# Patient Record
Sex: Female | Born: 1945 | Race: White | Hispanic: No | Smoking: Never smoker
Health system: Southern US, Community
[De-identification: ages and names within clinical notes are randomized; demographics above are authoritative.]

## PROBLEM LIST (undated history)

## (undated) ENCOUNTER — Emergency Department (HOSPITAL_COMMUNITY): Admission: EM | Discharge status: Against Medical Advice | Payer: Medicare Other

## (undated) DIAGNOSIS — I38 Endocarditis, valve unspecified: Secondary | ICD-10-CM

## (undated) DIAGNOSIS — K5790 Diverticulosis of intestine, part unspecified, without perforation or abscess without bleeding: Secondary | ICD-10-CM

## (undated) DIAGNOSIS — E119 Type 2 diabetes mellitus without complications: Secondary | ICD-10-CM

## (undated) DIAGNOSIS — K219 Gastro-esophageal reflux disease without esophagitis: Secondary | ICD-10-CM

## (undated) DIAGNOSIS — K222 Esophageal obstruction: Secondary | ICD-10-CM

## (undated) DIAGNOSIS — E7800 Pure hypercholesterolemia, unspecified: Secondary | ICD-10-CM

## (undated) DIAGNOSIS — G4733 Obstructive sleep apnea (adult) (pediatric): Secondary | ICD-10-CM

## (undated) DIAGNOSIS — Z9989 Dependence on other enabling machines and devices: Secondary | ICD-10-CM

## (undated) DIAGNOSIS — K2970 Gastritis, unspecified, without bleeding: Secondary | ICD-10-CM

## (undated) DIAGNOSIS — E039 Hypothyroidism, unspecified: Secondary | ICD-10-CM

## (undated) HISTORY — DX: Gastro-esophageal reflux disease without esophagitis: K21.9

## (undated) HISTORY — PX: TOTAL ABDOMINAL HYSTERECTOMY: SHX209

## (undated) HISTORY — PX: TUBAL LIGATION: SHX77

## (undated) HISTORY — DX: Gastritis, unspecified, without bleeding: K29.70

## (undated) HISTORY — PX: APPENDECTOMY: SHX54

## (undated) HISTORY — DX: Esophageal obstruction: K22.2

## (undated) HISTORY — DX: Hypothyroidism, unspecified: E03.9

## (undated) HISTORY — PX: CHOLECYSTECTOMY: SHX55

## (undated) HISTORY — DX: Pure hypercholesterolemia, unspecified: E78.00

## (undated) HISTORY — DX: Type 2 diabetes mellitus without complications: E11.9

## (undated) HISTORY — DX: Diverticulosis of intestine, part unspecified, without perforation or abscess without bleeding: K57.90

---

## 1988-12-26 HISTORY — PX: BREAST BIOPSY: SHX20

## 1999-01-28 ENCOUNTER — Ambulatory Visit (HOSPITAL_COMMUNITY): Admission: RE | Admit: 1999-01-28 | Discharge: 1999-01-28 | Payer: Self-pay | Admitting: Family Medicine

## 1999-01-28 ENCOUNTER — Encounter: Payer: Self-pay | Admitting: Family Medicine

## 1999-12-13 ENCOUNTER — Ambulatory Visit (HOSPITAL_COMMUNITY): Admission: RE | Admit: 1999-12-13 | Discharge: 1999-12-13 | Payer: Self-pay | Admitting: Podiatry

## 1999-12-13 ENCOUNTER — Encounter: Payer: Self-pay | Admitting: Podiatry

## 2000-01-31 ENCOUNTER — Encounter: Payer: Self-pay | Admitting: Family Medicine

## 2000-01-31 ENCOUNTER — Ambulatory Visit (HOSPITAL_COMMUNITY): Admission: RE | Admit: 2000-01-31 | Discharge: 2000-01-31 | Payer: Self-pay | Admitting: Family Medicine

## 2000-02-03 ENCOUNTER — Ambulatory Visit (HOSPITAL_COMMUNITY): Admission: RE | Admit: 2000-02-03 | Discharge: 2000-02-03 | Payer: Self-pay | Admitting: Family Medicine

## 2000-02-03 ENCOUNTER — Encounter: Payer: Self-pay | Admitting: Family Medicine

## 2001-02-12 ENCOUNTER — Ambulatory Visit (HOSPITAL_COMMUNITY): Admission: RE | Admit: 2001-02-12 | Discharge: 2001-02-12 | Payer: Self-pay | Admitting: Family Medicine

## 2001-02-12 ENCOUNTER — Encounter: Payer: Self-pay | Admitting: Family Medicine

## 2001-02-16 ENCOUNTER — Encounter: Admission: RE | Admit: 2001-02-16 | Discharge: 2001-02-16 | Payer: Self-pay | Admitting: Family Medicine

## 2001-02-16 ENCOUNTER — Encounter: Payer: Self-pay | Admitting: Family Medicine

## 2002-09-24 ENCOUNTER — Ambulatory Visit (HOSPITAL_COMMUNITY): Admission: RE | Admit: 2002-09-24 | Discharge: 2002-09-24 | Payer: Self-pay | Admitting: Family Medicine

## 2002-09-24 ENCOUNTER — Encounter: Payer: Self-pay | Admitting: Family Medicine

## 2003-01-10 ENCOUNTER — Ambulatory Visit (HOSPITAL_COMMUNITY): Admission: RE | Admit: 2003-01-10 | Discharge: 2003-01-10 | Payer: Self-pay | Admitting: Family Medicine

## 2003-01-10 ENCOUNTER — Encounter: Payer: Self-pay | Admitting: Family Medicine

## 2003-03-31 ENCOUNTER — Ambulatory Visit: Admission: RE | Admit: 2003-03-31 | Discharge: 2003-03-31 | Payer: Self-pay | Admitting: Orthopaedic Surgery

## 2003-04-07 ENCOUNTER — Encounter: Payer: Self-pay | Admitting: Family Medicine

## 2003-04-07 ENCOUNTER — Ambulatory Visit (HOSPITAL_COMMUNITY): Admission: RE | Admit: 2003-04-07 | Discharge: 2003-04-07 | Payer: Self-pay | Admitting: Family Medicine

## 2004-04-19 ENCOUNTER — Ambulatory Visit (HOSPITAL_COMMUNITY): Admission: RE | Admit: 2004-04-19 | Discharge: 2004-04-19 | Payer: Self-pay | Admitting: Family Medicine

## 2004-08-17 ENCOUNTER — Encounter (HOSPITAL_COMMUNITY): Admission: RE | Admit: 2004-08-17 | Discharge: 2004-09-16 | Payer: Self-pay | Admitting: Internal Medicine

## 2004-08-26 ENCOUNTER — Ambulatory Visit: Payer: Self-pay | Admitting: Psychiatry

## 2004-09-06 ENCOUNTER — Ambulatory Visit: Admission: RE | Admit: 2004-09-06 | Discharge: 2004-09-06 | Payer: Self-pay | Admitting: Pulmonary Disease

## 2005-03-22 ENCOUNTER — Ambulatory Visit (HOSPITAL_COMMUNITY): Admission: RE | Admit: 2005-03-22 | Discharge: 2005-03-22 | Payer: Self-pay | Admitting: Internal Medicine

## 2005-04-21 ENCOUNTER — Ambulatory Visit (HOSPITAL_COMMUNITY): Admission: RE | Admit: 2005-04-21 | Discharge: 2005-04-21 | Payer: Self-pay | Admitting: Internal Medicine

## 2005-07-07 ENCOUNTER — Ambulatory Visit (HOSPITAL_COMMUNITY): Admission: RE | Admit: 2005-07-07 | Discharge: 2005-07-07 | Payer: Self-pay | Admitting: *Deleted

## 2005-08-18 ENCOUNTER — Ambulatory Visit (HOSPITAL_COMMUNITY): Admission: RE | Admit: 2005-08-18 | Discharge: 2005-08-18 | Payer: Self-pay | Admitting: Pediatrics

## 2005-10-11 ENCOUNTER — Inpatient Hospital Stay (HOSPITAL_COMMUNITY): Admission: EM | Admit: 2005-10-11 | Discharge: 2005-10-14 | Payer: Self-pay | Admitting: Emergency Medicine

## 2006-01-30 ENCOUNTER — Ambulatory Visit (HOSPITAL_COMMUNITY): Admission: RE | Admit: 2006-01-30 | Discharge: 2006-01-30 | Payer: Self-pay | Admitting: Family Medicine

## 2006-04-24 ENCOUNTER — Ambulatory Visit (HOSPITAL_COMMUNITY): Admission: RE | Admit: 2006-04-24 | Discharge: 2006-04-24 | Payer: Self-pay | Admitting: Internal Medicine

## 2006-12-26 HISTORY — PX: THYROID SURGERY: SHX805

## 2007-01-22 ENCOUNTER — Encounter (HOSPITAL_COMMUNITY): Admission: RE | Admit: 2007-01-22 | Discharge: 2007-02-21 | Payer: Self-pay | Admitting: Endocrinology

## 2007-03-02 ENCOUNTER — Encounter (INDEPENDENT_AMBULATORY_CARE_PROVIDER_SITE_OTHER): Payer: Self-pay | Admitting: Specialist

## 2007-03-02 ENCOUNTER — Observation Stay (HOSPITAL_COMMUNITY): Admission: RE | Admit: 2007-03-02 | Discharge: 2007-03-03 | Payer: Self-pay | Admitting: General Surgery

## 2007-04-30 ENCOUNTER — Ambulatory Visit (HOSPITAL_COMMUNITY): Admission: RE | Admit: 2007-04-30 | Discharge: 2007-04-30 | Payer: Self-pay | Admitting: Internal Medicine

## 2007-06-19 ENCOUNTER — Emergency Department (HOSPITAL_COMMUNITY): Admission: RE | Admit: 2007-06-19 | Discharge: 2007-06-19 | Payer: Self-pay | Admitting: Internal Medicine

## 2008-05-01 ENCOUNTER — Ambulatory Visit (HOSPITAL_COMMUNITY): Admission: RE | Admit: 2008-05-01 | Discharge: 2008-05-01 | Payer: Self-pay | Admitting: Internal Medicine

## 2009-02-04 ENCOUNTER — Ambulatory Visit (HOSPITAL_COMMUNITY): Admission: RE | Admit: 2009-02-04 | Discharge: 2009-02-04 | Payer: Self-pay | Admitting: Internal Medicine

## 2009-02-26 ENCOUNTER — Encounter (INDEPENDENT_AMBULATORY_CARE_PROVIDER_SITE_OTHER): Payer: Self-pay | Admitting: *Deleted

## 2009-03-20 ENCOUNTER — Encounter (INDEPENDENT_AMBULATORY_CARE_PROVIDER_SITE_OTHER): Payer: Self-pay | Admitting: *Deleted

## 2009-05-08 ENCOUNTER — Ambulatory Visit (HOSPITAL_COMMUNITY): Admission: RE | Admit: 2009-05-08 | Discharge: 2009-05-08 | Payer: Self-pay | Admitting: Internal Medicine

## 2009-07-06 ENCOUNTER — Ambulatory Visit (HOSPITAL_COMMUNITY): Admission: RE | Admit: 2009-07-06 | Discharge: 2009-07-06 | Payer: Self-pay | Admitting: Internal Medicine

## 2009-11-24 ENCOUNTER — Ambulatory Visit (HOSPITAL_COMMUNITY): Admission: RE | Admit: 2009-11-24 | Discharge: 2009-11-24 | Payer: Self-pay | Admitting: Internal Medicine

## 2010-01-09 ENCOUNTER — Inpatient Hospital Stay (HOSPITAL_COMMUNITY): Admission: EM | Admit: 2010-01-09 | Discharge: 2010-01-13 | Payer: Self-pay | Admitting: Cardiology

## 2010-05-31 ENCOUNTER — Ambulatory Visit (HOSPITAL_COMMUNITY): Admission: RE | Admit: 2010-05-31 | Discharge: 2010-05-31 | Payer: Self-pay | Admitting: Internal Medicine

## 2010-09-30 ENCOUNTER — Ambulatory Visit (HOSPITAL_COMMUNITY): Admission: RE | Admit: 2010-09-30 | Discharge: 2010-09-30 | Payer: Self-pay | Admitting: General Surgery

## 2010-10-07 ENCOUNTER — Ambulatory Visit (HOSPITAL_COMMUNITY)
Admission: RE | Admit: 2010-10-07 | Discharge: 2010-10-07 | Payer: Self-pay | Source: Home / Self Care | Admitting: Cardiology

## 2010-10-07 LAB — PULMONARY FUNCTION TEST

## 2011-01-15 ENCOUNTER — Encounter: Payer: Self-pay | Admitting: Internal Medicine

## 2011-01-16 ENCOUNTER — Encounter: Payer: Self-pay | Admitting: Internal Medicine

## 2011-03-13 LAB — URINALYSIS, ROUTINE W REFLEX MICROSCOPIC
Bilirubin Urine: NEGATIVE
Protein, ur: NEGATIVE mg/dL
Specific Gravity, Urine: 1.015 (ref 1.005–1.030)
Urobilinogen, UA: 0.2 mg/dL (ref 0.0–1.0)

## 2011-03-13 LAB — BASIC METABOLIC PANEL
BUN: 14 mg/dL (ref 6–23)
CO2: 27 mEq/L (ref 19–32)
Calcium: 8.9 mg/dL (ref 8.4–10.5)
Glucose, Bld: 108 mg/dL — ABNORMAL HIGH (ref 70–99)
Potassium: 3.7 mEq/L (ref 3.5–5.1)
Sodium: 134 mEq/L — ABNORMAL LOW (ref 135–145)

## 2011-03-13 LAB — CBC
MCV: 88.1 fL (ref 78.0–100.0)
RBC: 4.03 MIL/uL (ref 3.87–5.11)
RDW: 14.5 % (ref 11.5–15.5)

## 2011-03-13 LAB — LIPASE, BLOOD: Lipase: 20 U/L (ref 11–59)

## 2011-03-13 LAB — POCT CARDIAC MARKERS
CKMB, poc: 1.8 ng/mL (ref 1.0–8.0)
CKMB, poc: <1 ng/mL — ABNORMAL LOW (ref 1.0–8.0)
Myoglobin, poc: 75.2 ng/mL (ref 12–200)
Troponin i, poc: <0.05 ng/mL (ref 0.00–0.09)
Troponin i, poc: <0.05 ng/mL (ref 0.00–0.09)

## 2011-03-13 LAB — URINE CULTURE: Colony Count: >=100000

## 2011-03-13 LAB — DIFFERENTIAL
Basophils Relative: 1 % (ref 0–1)
Eosinophils Relative: 2 % (ref 0–5)
Lymphs Abs: 1.2 10*3/uL (ref 0.7–4.0)
Monocytes Absolute: 0.6 10*3/uL (ref 0.1–1.0)
Monocytes Relative: 9 % (ref 3–12)

## 2011-03-13 LAB — URINE MICROSCOPIC-ADD ON

## 2011-03-13 LAB — CARDIAC PANEL(CRET KIN+CKTOT+MB+TROPI)
CK, MB: 0.8 ng/mL (ref 0.3–4.0)
Troponin I: 0.03 ng/mL (ref 0.00–0.06)

## 2011-03-14 LAB — CBC
HCT: 29.8 % — ABNORMAL LOW (ref 36.0–46.0)
Hemoglobin: 10.1 g/dL — ABNORMAL LOW (ref 12.0–15.0)
Hemoglobin: 9.9 g/dL — ABNORMAL LOW (ref 12.0–15.0)
Hemoglobin: 9.9 g/dL — ABNORMAL LOW (ref 12.0–15.0)
MCHC: 33.3 g/dL (ref 30.0–36.0)
MCHC: 34.1 g/dL (ref 30.0–36.0)
Platelets: 178 10*3/uL (ref 150–400)
Platelets: 207 10*3/uL (ref 150–400)
RBC: 3.73 MIL/uL — ABNORMAL LOW (ref 3.87–5.11)
RDW: 14.4 % (ref 11.5–15.5)
RDW: 14.5 % (ref 11.5–15.5)
RDW: 14.5 % (ref 11.5–15.5)
WBC: 4.3 10*3/uL (ref 4.0–10.5)

## 2011-03-14 LAB — BASIC METABOLIC PANEL
BUN: 16 mg/dL (ref 6–23)
CO2: 27 mEq/L (ref 19–32)
Calcium: 8.1 mg/dL — ABNORMAL LOW (ref 8.4–10.5)
Calcium: 8.2 mg/dL — ABNORMAL LOW (ref 8.4–10.5)
Calcium: 8.3 mg/dL — ABNORMAL LOW (ref 8.4–10.5)
Creatinine, Ser: 1.2 mg/dL (ref 0.4–1.2)
Creatinine, Ser: 1.22 mg/dL — ABNORMAL HIGH (ref 0.4–1.2)
Creatinine, Ser: 1.25 mg/dL — ABNORMAL HIGH (ref 0.4–1.2)
GFR calc Af Amer: 55 mL/min — ABNORMAL LOW (ref >60)
GFR calc non Af Amer: 43 mL/min — ABNORMAL LOW (ref >60)
GFR calc non Af Amer: 46 mL/min — ABNORMAL LOW (ref >60)
Glucose, Bld: 84 mg/dL (ref 70–99)
Glucose, Bld: 94 mg/dL (ref 70–99)
Sodium: 137 mEq/L (ref 135–145)
Sodium: 141 mEq/L (ref 135–145)

## 2011-03-14 LAB — CARDIAC PANEL(CRET KIN+CKTOT+MB+TROPI)
CK, MB: 1 ng/mL (ref 0.3–4.0)
Relative Index: INVALID (ref 0.0–2.5)
Total CK: 44 U/L (ref 7–177)
Troponin I: 0.01 ng/mL (ref 0.00–0.06)
Troponin I: 0.04 ng/mL (ref 0.00–0.06)

## 2011-03-14 LAB — COMPREHENSIVE METABOLIC PANEL
BUN: 12 mg/dL (ref 6–23)
Calcium: 7.9 mg/dL — ABNORMAL LOW (ref 8.4–10.5)
Glucose, Bld: 92 mg/dL (ref 70–99)
Sodium: 136 mEq/L (ref 135–145)
Total Protein: 5.4 g/dL — ABNORMAL LOW (ref 6.0–8.3)

## 2011-03-14 LAB — IRON AND TIBC
Iron: 41 ug/dL — ABNORMAL LOW (ref 42–135)
TIBC: 256 ug/dL (ref 250–470)
UIBC: 215 ug/dL

## 2011-03-14 LAB — FERRITIN: Ferritin: 124 ng/mL (ref 10–291)

## 2011-03-14 LAB — LIPID PANEL
LDL Cholesterol: 99 mg/dL (ref 0–99)
Total CHOL/HDL Ratio: 4.1 RATIO
VLDL: 20 mg/dL (ref 0–40)

## 2011-05-13 NOTE — Discharge Summary (Signed)
NAMEKERIE, BADGER                 ACCOUNT NO.:  [id number]   MEDICAL RECORD NO.:  [id number]          PATIENT TYPE:  INP   LOCATION:  A210                          FACILITY:  [facility]   PHYSICIAN:  [name], MD  DATE OF BIRTH:  03/27/1946   DATE OF ADMISSION:  10/11/2005  DATE OF DISCHARGE:  10/20/2006LH                                 DISCHARGE SUMMARY   DISCHARGE DIAGNOSIS:  Sigmoid diverticulosis complicated by lower  gastrointestinal bleed.   DISCHARGE MEDICATIONS:  Continuation of outpatient thyroid replacement  therapy as well as proton pump inhibitor.   SUMMARY:  Patient was admitted with progressively increasing lower GI  bleed/hematochezia.  She had drop in her hemoglobin and hematocrit  documented but did not require transfusion.  She subsequently underwent  total colonoscopy for hematochezia and was found to have sigmoid  diverticulosis, presumably the source of the bleed, otherwise negative per  report of Dr. Lovell Sheehan.  When she was stable without recurrent bleeding she  was discharged; she was to avoid any antiplatelet therapy.      [name], MD  Electronically Signed     LJF/MEDQ  D:  11/20/2005  T:  11/20/2005  Job:  [id number]

## 2011-05-13 NOTE — Procedures (Signed)
[name], [name]                 ACCOUNT NO.:  [id number]   MEDICAL RECORD NO.:  [id number]          PATIENT TYPE:  OUT   LOCATION:  SLEEP LAB                     FACILITY:  [facility]   PHYSICIAN:  Marcelyn Bruins, M.D. [facility] DATE OF BIRTH:  03-29-1946   DATE OF ADMISSION:  09/06/2004                              NOCTURNAL POLYSOMNOGRAM   REFERRING PHYSICIAN:  Dr. Kari Baars   INDICATION FOR STUDY:  Hypersomnia with sleep apnea.   SLEEP ARCHITECTURE:  The patient had a total sleep time of 362 minutes with  adequate REM and slow wave sleep.  Sleep onset latency as well as REM onset  were normal.  Sleep efficiency was 86%.   IMPRESSION:  1.  Split night study reveals mild to moderate obstructive sleep apnea      during the first half of the night with the patient having 45      obstructive events in the first 166 minutes of sleep.  This gave her a      respiratory disturbance index of 16 events per hour and O2 desaturation      as low as 80%.  The events were not positional.  There was moderate      snoring noted pre CPAP.  As per split night protocol, CPAP was initiated      with a small Respironics mask and titrated to a final pressure of 7 cm      with excellent control of events even through REM.  It should be noted      the patient usually wears oxygen at night while at home; however,      __________ was added during the sleep study.  There was approximately 69      minutes during the study where the patient spent time at a saturation of      81-90%.  There appeared to be very few desaturations once CPAP was      titrated to its optimal level.  2.  No clinically significant cardiac arrhythmias.      KC/MEDQ  D:  10/07/2004 16:02:15  T:  10/07/2004 16:29:11  Job:  [id number]

## 2011-05-13 NOTE — H&P (Signed)
[name], [name]                 ACCOUNT NO.:  [id number]   MEDICAL RECORD NO.:  [id number]          PATIENT TYPE:  INP   LOCATION:  A210                          FACILITY:  [facility]   PHYSICIAN:  [name], MD  DATE OF BIRTH:  [birth year]   DATE OF ADMISSION:  DATE OF DISCHARGE:  LH                                HISTORY & PHYSICAL   CHIEF COMPLAINT:  Rectal bleeding.   HISTORY OF PRESENT ILLNESS:  The patient has had 1 week of scant rectal  bleeding associated with bowel movements.  She denied any hematemesis,  hematochezia or melena.  She has had some postprandial diarrhea that has  been chronic and longstanding with no change in that pattern.  However,  today, the day of office visitation, she had a striking amount of blood,  according to her.  She describes it as greater than 1 cup to 2 cups in  amount.  It was a single episode and no repeats.  Again she denied abdominal  pain.  She relates to me that approximately 10-2 years ago she had a  colonoscopy in [place] done that revealed polyps.  However, no follow-up  colonoscopy was done in the past 10 years that she is aware of.   PAST MEDICAL HISTORY:  Hypothyroidism with some recent adjustments in her  thyroid medication.  She has a history of gastroesophageal reflux disease,  post-traumatic stress disorder, osteoarthritis, peripheral neuropathy.  Status post complete hysterectomy and a history of fibromyalgia and sleep  apnea.   FAMILY HISTORY:  Positive for COPD, hypertension, cerebrovascular accident  and thyroid disease in two sisters.   SOCIAL HISTORY:  Nonsmoker, nondrinker.  No other drug use.  She works as a  Systems analyst.   REVIEW OF SYSTEMS:  As above.  All else is negative.   PHYSICAL EXAMINATION:  SKIN:  Unremarkable.  HEAD AND NECK:  No JVD or adenopathy.  NECK:  Supple.  CHEST:  Clear.  CARDIAC:  Regular rhythm without murmur, gallop or rub.  ABDOMEN:  Soft.  No organomegaly or masses.   No guarding or rebound  tenderness.  EXTREMITIES:  Without clubbing, cyanosis or edema.  NEUROLOGIC:  Examination is normal.   IMPRESSION:  1.  A marked amount of lower gastrointestinal bleeding.  We are admitting      her mostly to see if it happens again as well as follow serial      hematocrits.  We discussed the most likely etiologies being benign      statistically.  However, with her history of colonic polyposis and no      followup, we do have to be concerned enough to do an urgent colonoscopy.      This will be arranged in house.  2.  Hypothyroidism.  We will check her thyroid function and adjust      medications as needed.  3.  Gastroesophageal reflux disease.  We will continue her proton pump      inhibitor therapy.  I will add an H. pylori assessment as well.      Lyman Bishop  J. Sherwood Gambler, MD  Electronically Signed     LJF/MEDQ  D:  10/11/2005  T:  10/11/2005  Job:  [id number]

## 2011-05-13 NOTE — H&P (Signed)
[name], [name]                 ACCOUNT NO.:  [id number]   MEDICAL RECORD NO.:  [id number]           PATIENT TYPE:  AMB   LOCATION:                                FACILITY:  [facility]   PHYSICIAN:  Dalia Heading, M.D.  DATE OF BIRTH:  [birth year]   DATE OF ADMISSION:  DATE OF DISCHARGE:  LH                              HISTORY & PHYSICAL   CHIEF COMPLAINT:  Multinodular goiter.   HISTORY OF PRESENT ILLNESS:  The patient is a 65 year old white female  who was referred for evaluation and treatment of a multinodular goiter.  She has been on suppressive therapy for many years.  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  She is  referred by Dr. Patrecia Pace for a total thyroidectomy.  She denies any  heart palpitations, weight loss, radiation to the neck, or voice  changes.   PAST MEDICAL HISTORY:  1. As noted above.  2. GERD.   PAST SURGICAL HISTORY:  1. Hysterectomy.  2. Cholecystectomy.  3. Colonoscopy.   CURRENT MEDICATIONS:  1. Synthroid 150 mcg p.o. daily.  2. Nexium 40 mg p.o. b.i.d.   ALLERGIES:  NO KNOWN DRUG ALLERGIES.   REVIEW OF SYSTEMS:  Noncontributory.   PHYSICAL EXAMINATION:  GENERAL:  The patient is a well-developed, well-  nourished white female in no acute distress.  NECK:  Supple without bruits.  No lymphadenopathy is noted.  The left  lobe is noted be slightly larger than the right.  There is no tracheal  deviation.  LUNGS:  Clear to auscultation with equal breath sounds bilaterally.  HEART:  Regular rate and rhythm without history, S4, or murmurs.   IMPRESSION:  Multinodular goiter.   PLAN:  The patient is scheduled for total thyroidectomy on March 02, 2007.  The risks and benefits of the procedure including bleeding,  infection, nerve injury, tracheostomy, and voice changes were fully  explained to the patient who gave informed consent.  She is to start  calcium supplements prior to the surgery.      Dalia Heading, M.D.  Electronically Signed     MAJ/MEDQ  D:  02/22/2007  T:  02/22/2007  Job:  [id number]   cc:   Alan Mulder, M.D.  Fax: [phone]   [name], MD  Fax: [phone]

## 2011-05-13 NOTE — Op Note (Signed)
[name], [name]                 ACCOUNT NO.:  [id number]   MEDICAL RECORD NO.:  [id number]          PATIENT TYPE:  OBV   LOCATION:  IC03                          FACILITY:  [facility]   PHYSICIAN:  Dalia Heading, M.D.  DATE OF BIRTH:  [birth year]   DATE OF PROCEDURE:  03/02/2007  DATE OF DISCHARGE:                               OPERATIVE REPORT   PREOPERATIVE DIAGNOSES:  1. Multinodular goiter.  2. Thyroiditis.   POSTOPERATIVE DIAGNOSES:  1. Multinodular goiter.  2. Thyroiditis.   PROCEDURE:  Total thyroidectomy.   SURGEON:  Dalia Heading, MD   ANESTHESIA:  General endotracheal.   INDICATIONS:  The patient is a 65 year old white female who has been  followed by Dr. Ocie Doyne for many years for treatment of a multinodular  goiter and multiple adenomas and both lobes of the thyroid.  She has  been on suppressive Synthroid therapy for years but is failing medical  therapy.  She now comes to the operating room for a total thyroidectomy.  The risks and benefits of the procedure including bleeding, infection,  nerve injury, tracheostomy, and voice changes, were fully explained to  the patient, who gave informed consent.   PROCEDURE NOTE:  The patient was placed in the supine position.  After  induction of general endotracheal anesthesia, the neck was extended.  The neck was then prepped using the usual sterile technique with  Betadine.  Surgical site confirmation was performed.   A transverse incision was made just above the jugular notch.  This was  done in a skin fold.  The platysma was then divided.  The strap muscles  were then divided longitudinally from the cricoid cartilage down to the  jugular notch.  The right and left lobes of the thyroid were inspected.  Multiple nodules were present.  There was also significant amount of  inflammatory response around both lobes.  The right lobe of the thyroid  was first mobilized.  The middle thyroidal vein, ligament of Berry,  superior thyroidal artery, and inferior thyroidal arteries were  identified, ligated, and divided.  The thyroid was then freed away from  the underlying soft tissue lateral to medial.  Care was taken to avoid  the recurrent laryngeal nerve.  The superior parathyroid was noted.  The  inferior parathyroid was not identified.  This was then removed.  The  right lobe and isthmus were then removed and sent to pathology for  further examination.  The left lobe of the thyroid was likewise  mobilized lateral to medial.  The superior thyroidal artery, middle  thyroidal vein, and inferior thyroidal artery and vein complex were  ligated and divided.  Again care was taken to avoid the left recurrent  laryngeal nerve.  The dissection was carried lateral to medial.  This  was then removed from the operative field and sent to pathology for  further examination.  Any bleeding was controlled using either clips or  Surgicel.  No abnormal bleeding was noted at the end of the procedure.  As stated previously, there was a significant amount of inflammatory  response and  fibrotic reaction present in this region.  A small drain  was placed into the soft tissue and brought through a separate stab  wound to the left of the incision.  It was secured in place at the skin  level using a 3-0 nylon interrupted suture.  The strap muscles were  reapproximated using a 3-0 Vicryl running suture.  The platysma was  reapproximated using a 3-0 Vicryl running suture.  The skin was closed  using a 4-0 Vicryl subcuticular suture.  Sensorcaine 0.5% was instilled  in the surrounding wound.  Dermabond was then applied.   All tape and needle counts were correct at the end of the procedure.  The patient was extubated under direct visualization.  Both vocal cords  moved freely without difficulty.  The patient was able to phonate E at  the end of the procedure.  The patient was transferred to PACU in stable  condition.    COMPLICATIONS:  None.   SPECIMEN:  Thyroid lobes, right, left.   BLOOD LOSS:  50 mL.   DRAINS:  One drain to thyroid bed.      Dalia Heading, M.D.  Electronically Signed     MAJ/MEDQ  D:  03/02/2007  T:  03/02/2007  Job:  [id number]   cc:   Alan Mulder, M.D.  Fax: [phone]   [name], MD  Fax: [phone]

## 2011-05-13 NOTE — Discharge Summary (Signed)
NAMEDWAYNA, KENTNER NO.:  [id number]   MEDICAL RECORD NO.:  [id number]          PATIENT TYPE:  OBV   LOCATION:  IC03                          FACILITY:  [facility]   PHYSICIAN:  Dalia Heading, M.D.  DATE OF BIRTH:  May 24, 1946   DATE OF ADMISSION:  03/02/2007  DATE OF DISCHARGE:  LH                               DISCHARGE SUMMARY   HOSPITAL COURSE SUMMARY:  The patient is a 66 year old white female with  multinodular goiter and thyroiditis who underwent a total thyroidectomy  on March 02, 2007.  She tolerated the procedure well.  She went to the  intensive care unit postoperatively for routine monitoring.  She did  well overnight.  Her drain came out this morning.  Her last calcium was  8.2 which has been stable.  She denies any tetanus or periorbital  paresthesias.   The patient is being discharged in good and stable condition.   DISCHARGE INSTRUCTIONS:  The patient is follow up Dr. Franky Macho on  March 08, 2007.   DISCHARGE MEDICATIONS:  1. Synthroid 115 mcg p.o. daily.  2. Calcium supplements 600 mg 1 tablet three times a day.  3. Vicodin 1-2 tablets p.o. q.4 h p.r.n. pain.  4. Nexium 40 mg p.o. daily.  5. Xanax 0.5 mg p.o. q.6 h p.r.n.   PRINCIPAL DIAGNOSES:  1. Multinodular goiter, thyroiditis.  2. Gastroesophageal reflux disease.   PRINCIPAL PROCEDURE:  Total thyroidectomy on March 02, 2007.      Dalia Heading, M.D.  Electronically Signed     MAJ/MEDQ  D:  03/03/2007  T:  03/03/2007  Job:  [id number]   cc:   Alan Mulder, M.D.  Fax: [phone]   [name], MD  Fax: [phone]

## 2011-06-06 ENCOUNTER — Other Ambulatory Visit (HOSPITAL_COMMUNITY): Payer: Self-pay | Admitting: Internal Medicine

## 2011-06-06 DIAGNOSIS — Z139 Encounter for screening, unspecified: Secondary | ICD-10-CM

## 2011-06-14 ENCOUNTER — Other Ambulatory Visit (HOSPITAL_COMMUNITY): Payer: Self-pay | Admitting: Family Medicine

## 2011-06-14 DIAGNOSIS — Z139 Encounter for screening, unspecified: Secondary | ICD-10-CM

## 2011-06-16 ENCOUNTER — Ambulatory Visit (HOSPITAL_COMMUNITY)
Admission: RE | Admit: 2011-06-16 | Discharge: 2011-06-16 | Disposition: A | Discharge status: Home / Self Care | Payer: Medicare Other | Source: Ambulatory Visit | Attending: Internal Medicine | Admitting: Internal Medicine

## 2011-06-16 DIAGNOSIS — Z139 Encounter for screening, unspecified: Secondary | ICD-10-CM

## 2011-06-16 DIAGNOSIS — Z1231 Encounter for screening mammogram for malignant neoplasm of breast: Secondary | ICD-10-CM | POA: Insufficient documentation

## 2011-08-31 ENCOUNTER — Other Ambulatory Visit (HOSPITAL_COMMUNITY): Payer: Self-pay | Admitting: Family Medicine

## 2011-08-31 ENCOUNTER — Ambulatory Visit (HOSPITAL_COMMUNITY)
Admission: RE | Admit: 2011-08-31 | Discharge: 2011-08-31 | Disposition: A | Discharge status: Home / Self Care | Payer: Medicare Other | Source: Ambulatory Visit | Attending: Family Medicine | Admitting: Family Medicine

## 2011-08-31 ENCOUNTER — Encounter (HOSPITAL_COMMUNITY): Payer: Self-pay

## 2011-08-31 DIAGNOSIS — R1032 Left lower quadrant pain: Secondary | ICD-10-CM | POA: Insufficient documentation

## 2011-08-31 DIAGNOSIS — K5730 Diverticulosis of large intestine without perforation or abscess without bleeding: Secondary | ICD-10-CM | POA: Insufficient documentation

## 2011-08-31 MED ORDER — IOHEXOL 300 MG/ML  SOLN
100.0000 mL | Freq: Once | INTRAMUSCULAR | Status: AC | PRN
  Administered 2011-08-31: 100 mL via INTRAVENOUS

## 2011-09-02 ENCOUNTER — Other Ambulatory Visit (HOSPITAL_COMMUNITY): Payer: Self-pay | Admitting: Family Medicine

## 2011-09-02 ENCOUNTER — Ambulatory Visit (HOSPITAL_COMMUNITY)
Admission: RE | Admit: 2011-09-02 | Discharge: 2011-09-02 | Disposition: A | Discharge status: Home / Self Care | Payer: Medicare Other | Source: Ambulatory Visit | Attending: Family Medicine | Admitting: Family Medicine

## 2011-09-02 DIAGNOSIS — R52 Pain, unspecified: Secondary | ICD-10-CM

## 2011-09-02 DIAGNOSIS — R0789 Other chest pain: Secondary | ICD-10-CM | POA: Insufficient documentation

## 2011-09-12 ENCOUNTER — Encounter: Payer: Self-pay | Admitting: Gastroenterology

## 2011-09-12 ENCOUNTER — Ambulatory Visit (INDEPENDENT_AMBULATORY_CARE_PROVIDER_SITE_OTHER): Payer: Medicare Other | Admitting: Gastroenterology

## 2011-09-12 VITALS — BP 148/79 | HR 60 | Temp 97.4°F | Ht 66.0 in | Wt 190.0 lb

## 2011-09-12 DIAGNOSIS — R1310 Dysphagia, unspecified: Secondary | ICD-10-CM

## 2011-09-12 DIAGNOSIS — R197 Diarrhea, unspecified: Secondary | ICD-10-CM

## 2011-09-12 DIAGNOSIS — R1012 Left upper quadrant pain: Secondary | ICD-10-CM

## 2011-09-12 LAB — TSH: TSH: 0.194 u[IU]/mL — ABNORMAL LOW (ref 0.350–4.500)

## 2011-09-12 MED ORDER — HYDROCORTISONE ACETATE 25 MG RE SUPP: 25.0000 mg | Freq: Two times a day (BID) | RECTAL | Status: AC

## 2011-09-12 NOTE — Progress Notes (Signed)
Primary Care Physician:  Alice Reichert, MD Primary Gastroenterologist:  Dr. Darrick Penna  Chief Complaint  Patient presents with  . Abdominal Pain    hurt the most when moving    HPI:   [name] is a 65 year old female who presents at the request of Dr. Renard Matter secondary to LUQ pain. She reports onset 2 weeks ago, worsened with standing/moving/walking. Laying down and sitting eases pain. Described as stabbing, not associated with eating or drinking. Occasional nausea, no vomiting. Denies loss of appetite. No NSAIDs or aspirin powders. Intermittent dysphagia. Reports EGD years ago.  Also reports loose stools X 2 weeks, watery. BRBPR X 2 weeks. Colonoscopy last year with Dr. Lovell Sheehan.   CT Sept 2012 with descending colonic and sigmoid diverticulosis. No diverticulitis.  CBC normal.   Past Medical History  Diagnosis Date  . Hypothyroidism   . GERD (gastroesophageal reflux disease)   . Hypercholesterolemia     can't take meds, affects LFTs  . S/P colonoscopy Oct 2011    Dr. Lovell Sheehan: mild diverticulosis in sigmoid, otherwise normal    Past Surgical History  Procedure Date  . Total abdominal hysterectomy   . Cholecystectomy   . Tubal ligation   . Thyroid surgery 2008    left  thyroid removed    Current Outpatient Prescriptions  Medication Sig Dispense Refill  . levothyroxine (SYNTHROID, LEVOTHROID) 88 MCG tablet Take 88 mcg by mouth daily.        . pantoprazole (PROTONIX) 40 MG tablet Take 40 mg by mouth daily.        . hydrocortisone (ANUSOL-HC) 25 MG suppository Place 1 suppository (25 mg total) rectally every 12 (twelve) hours.  20 suppository  0    Allergies as of 09/12/2011  . (No Known Allergies)    Family History  Problem Relation Age of Onset  . Colon cancer Mother     in remission, diagnosed age 34    History   Social History  . Marital Status: Widowed    Spouse Name: N/A    Number of Children: 3  . Years of Education: N/A   Occupational History  .  retired     Games developer    Social History Main Topics  . Smoking status: Never Smoker   . Smokeless tobacco: Not on file  . Alcohol Use: No  . Drug Use: No  . Sexually Active: Not on file   Other Topics Concern  . Not on file   Social History Narrative  . No narrative on file    Review of Systems: Gen: Denies any fever, chills, fatigue, weight loss, lack of appetite.  CV: Denies chest pain, heart palpitations, peripheral edema, syncope.  Resp: Denies shortness of breath at rest or with exertion. Denies wheezing or cough.  GI: Denies  odynophagia. Denies jaundice, hematemesis, fecal incontinence. GU : Denies urinary burning, urinary frequency, urinary hesitancy MS: Denies joint pain, muscle weakness, cramps, or limitation of movement.  Derm: Denies rash, itching, dry skin Psych: Denies depression, anxiety, memory loss, and confusion Heme: Denies bruising, bleeding, and enlarged lymph nodes.  Physical Exam: BP 148/79  Pulse 60  Temp(Src) 97.4 F (36.3 C) (Temporal)  Ht 5\' 6"  (1.676 m)  Wt 190 lb (86.183 kg)  BMI 30.67 kg/m2 General:   Alert and oriented. Pleasant and cooperative. Well-nourished and well-developed.  Head:  Normocephalic and atraumatic. Eyes:  Without icterus, sclera clear and conjunctiva pink.  Ears:  Normal auditory acuity. Nose:  No deformity, discharge,  or lesions. Mouth:  No deformity or lesions, oral mucosa pink.  Neck:  Supple, without mass or thyromegaly. Lungs:  Clear to auscultation bilaterally. No wheezes, rales, or rhonchi. No distress.  Heart:  S1, S2 present without murmurs appreciated.  Abdomen:  +BS, soft, mildly tender to palpation epigastric/LUQ. non-distended. No HSM noted. No guarding or rebound. No masses appreciated.  Rectal:  Mild amount of redness perirectally consistent with irritation, small hemorrhoidal tag, good sphincter tone, no Harriss blood noted Msk:  Symmetrical without Kyte deformities. Normal posture. Extremities:   Without clubbing or edema. Neurologic:  Alert and  oriented x4;  grossly normal neurologically. Skin:  Intact without significant lesions or rashes. Cervical Nodes:  No significant cervical adenopathy. Psych:  Alert and cooperative. Normal mood and affect.

## 2011-09-12 NOTE — Patient Instructions (Signed)
Please continue taking Protonix.  Complete labs (TSH). We will call you with the results.  Please complete stool studies as well. We will call you with those results, too.  Please fill the prescription for hemorrhoids.  We have set you up for an endoscopy with Dr. Darrick Penna in the near future. Further recommendations will follow after completed.

## 2011-09-13 DIAGNOSIS — R1012 Left upper quadrant pain: Secondary | ICD-10-CM | POA: Insufficient documentation

## 2011-09-13 DIAGNOSIS — R197 Diarrhea, unspecified: Secondary | ICD-10-CM | POA: Insufficient documentation

## 2011-09-13 DIAGNOSIS — R1310 Dysphagia, unspecified: Secondary | ICD-10-CM | POA: Insufficient documentation

## 2011-09-13 NOTE — Assessment & Plan Note (Signed)
65 year old with LUQ pain X 2 weeks, worsened with movement, relieved by laying/sitting. +nausea intermittently, no vomiting. No NSAIDs, aspirin powders. CT unrevealing. Intermittent dysphagia. Question of musculoskeletal etiology; however, unable to rule out GI process at this time. With dysphagia, will need to pursue EGD for further assessment.   Proceed with upper endoscopy, possible dilation in the near future with Dr. Darrick Penna. The risks, benefits, and alternatives have been discussed in detail with patient. They have stated understanding and desire to proceed.  Continue Protonix

## 2011-09-13 NOTE — Assessment & Plan Note (Signed)
Addressed. See notes under LUQ. ?uncontrolled GERD, web, ring, or stricture.

## 2011-09-13 NOTE — Assessment & Plan Note (Signed)
Loose stools X 2 weeks. Intermittent BRBPR. Recent colonoscopy by Dr. Lovell Sheehan, reports requested. Rectal exam with external hemorrhoidal tag. Likely benign anorectal source, internal hemorrhoids. Will treat with Anusol suppositories, obtain TSH, stool studies.  TSH Cdiff PCR, Culture, Giardia, lactoferrin Obtain op notes

## 2011-09-14 ENCOUNTER — Other Ambulatory Visit: Payer: Self-pay | Admitting: Gastroenterology

## 2011-09-14 NOTE — Progress Notes (Signed)
Cc to PCP 

## 2011-09-15 NOTE — Progress Notes (Signed)
Quick Note:  Pt informed. Called and spoke with [name] at Dr. Renard Matter office. She is aware and will be expecting the fax. Routed to Soledad Gerlach to fax. ______

## 2011-09-15 NOTE — Progress Notes (Signed)
Results Cc to PCP  

## 2011-09-18 LAB — STOOL CULTURE

## 2011-09-19 NOTE — Progress Notes (Signed)
Quick Note:  Pt informed. Has appt with PCP Wed this week. Says diarrhea is much better. ______

## 2011-09-20 NOTE — Progress Notes (Signed)
Quick Note:  Glad to hear. ______

## 2011-09-22 NOTE — Progress Notes (Signed)
Labs reviewed/ should see pcp RE: tsh.  REVIEWED. AGREE.

## 2011-09-27 MED ORDER — SODIUM CHLORIDE 0.45 % IV SOLN
Freq: Once | INTRAVENOUS | Status: AC
  Administered 2011-09-28: 09:00:00 via INTRAVENOUS

## 2011-09-28 ENCOUNTER — Ambulatory Visit (HOSPITAL_COMMUNITY)
Admission: RE | Admit: 2011-09-28 | Discharge: 2011-09-28 | Disposition: A | Discharge status: Home / Self Care | Payer: Medicare Other | Source: Ambulatory Visit | Attending: Gastroenterology | Admitting: Gastroenterology

## 2011-09-28 ENCOUNTER — Encounter (HOSPITAL_COMMUNITY)
Admission: RE | Disposition: A | Discharge status: Home / Self Care | Payer: Self-pay | Source: Ambulatory Visit | Attending: Gastroenterology

## 2011-09-28 ENCOUNTER — Encounter (HOSPITAL_COMMUNITY): Payer: Self-pay | Admitting: *Deleted

## 2011-09-28 ENCOUNTER — Other Ambulatory Visit: Payer: Self-pay | Admitting: Gastroenterology

## 2011-09-28 DIAGNOSIS — K2940 Chronic atrophic gastritis without bleeding: Secondary | ICD-10-CM | POA: Insufficient documentation

## 2011-09-28 DIAGNOSIS — K2970 Gastritis, unspecified, without bleeding: Secondary | ICD-10-CM

## 2011-09-28 DIAGNOSIS — R1032 Left lower quadrant pain: Secondary | ICD-10-CM | POA: Insufficient documentation

## 2011-09-28 DIAGNOSIS — K222 Esophageal obstruction: Secondary | ICD-10-CM | POA: Insufficient documentation

## 2011-09-28 DIAGNOSIS — D131 Benign neoplasm of stomach: Secondary | ICD-10-CM | POA: Insufficient documentation

## 2011-09-28 DIAGNOSIS — R1310 Dysphagia, unspecified: Secondary | ICD-10-CM

## 2011-09-28 DIAGNOSIS — E7800 Pure hypercholesterolemia, unspecified: Secondary | ICD-10-CM | POA: Insufficient documentation

## 2011-09-28 DIAGNOSIS — K2990 Gastroduodenitis, unspecified, without bleeding: Secondary | ICD-10-CM

## 2011-09-28 DIAGNOSIS — R1012 Left upper quadrant pain: Secondary | ICD-10-CM

## 2011-09-28 DIAGNOSIS — Z800 Family history of malignant neoplasm of digestive organs: Secondary | ICD-10-CM | POA: Insufficient documentation

## 2011-09-28 HISTORY — PX: ESOPHAGOGASTRODUODENOSCOPY: SHX1529

## 2011-09-28 SURGERY — ESOPHAGOGASTRODUODENOSCOPY (EGD) WITH ESOPHAGEAL DILATION
Anesthesia: Moderate Sedation

## 2011-09-28 MED ORDER — BUTAMBEN-TETRACAINE-BENZOCAINE 2-2-14 % EX AERO
INHALATION_SPRAY | CUTANEOUS | Status: DC | PRN
  Administered 2011-09-28: 1 via TOPICAL

## 2011-09-28 MED ORDER — MINERAL OIL PO OIL
TOPICAL_OIL | ORAL | Status: AC
  Filled 2011-09-28: qty 30

## 2011-09-28 MED ORDER — MEPERIDINE HCL 100 MG/ML IJ SOLN
INTRAMUSCULAR | Status: DC | PRN
  Administered 2011-09-28: 25 mg
  Administered 2011-09-28: 50 mg
  Administered 2011-09-28: 25 mg

## 2011-09-28 MED ORDER — MIDAZOLAM HCL 5 MG/5ML IJ SOLN
INTRAMUSCULAR | Status: DC | PRN
  Administered 2011-09-28 (×2): 2 mg via INTRAVENOUS
  Administered 2011-09-28: 1 mg via INTRAVENOUS

## 2011-09-28 MED ORDER — STERILE WATER FOR IRRIGATION IR SOLN
Status: DC | PRN
  Administered 2011-09-28: 10:00:00

## 2011-09-28 MED ORDER — MEPERIDINE HCL 100 MG/ML IJ SOLN
INTRAMUSCULAR | Status: AC
  Filled 2011-09-28: qty 1

## 2011-09-28 MED ORDER — MIDAZOLAM HCL 5 MG/5ML IJ SOLN
INTRAMUSCULAR | Status: AC
  Filled 2011-09-28: qty 10

## 2011-09-28 NOTE — H&P (Signed)
Reason for Visit     Abdominal Pain    hurt the most when moving        Vitals - Last Recorded       BP Pulse Temp(Src) Ht Wt BMI    148/79  60  97.4 F (36.3 C) (Temporal)  5\' 6"  (1.676 m)  190 lb (86.183 kg)  30.67 kg/m2          Progress Notes     Gerrit Halls, NP  09/13/2011 11:55 PM  Signed   Primary Care Physician:  Alice Reichert, MD Primary Gastroenterologist:  Dr. Darrick Penna    Chief Complaint   Patient presents with   .  Abdominal Pain       hurt the most when moving      HPI:    Ms. [name] is a 65 year old female who presents at the request of Dr. Renard Matter secondary to LUQ pain. She reports onset 2 weeks ago, worsened with standing/moving/walking. Laying down and sitting eases pain. Described as stabbing, not associated with eating or drinking. Occasional nausea, no vomiting. Denies loss of appetite. No NSAIDs or aspirin powders. Intermittent dysphagia. Reports EGD years ago.   Also reports loose stools X 2 weeks, watery. BRBPR X 2 weeks. Colonoscopy last year with Dr. Lovell Sheehan.    CT Sept 2012 with descending colonic and sigmoid diverticulosis. No diverticulitis.   CBC normal.     Past Medical History   Diagnosis  Date   .  Hypothyroidism     .  GERD (gastroesophageal reflux disease)     .  Hypercholesterolemia         can't take meds, affects LFTs   .  S/P colonoscopy  Oct 2011       Dr. Lovell Sheehan: mild diverticulosis in sigmoid, otherwise normal       Past Surgical History   Procedure  Date   .  Total abdominal hysterectomy     .  Cholecystectomy     .  Tubal ligation     .  Thyroid surgery  2008       left  thyroid removed       Current Outpatient Prescriptions   Medication  Sig  Dispense  Refill   .  levothyroxine (SYNTHROID, LEVOTHROID) 88 MCG tablet  Take 88 mcg by mouth daily.           .  pantoprazole (PROTONIX) 40 MG tablet  Take 40 mg by mouth daily.           .  hydrocortisone (ANUSOL-HC) 25 MG suppository  Place 1 suppository (25 mg  total) rectally every 12 (twelve) hours.   20 suppository   0       Allergies as of 09/12/2011   .  (No Known Allergies)       Family History   Problem  Relation  Age of Onset   .  Colon cancer  Mother         in remission, diagnosed age 46       History       Social History   .  Marital Status:  Widowed       Spouse Name:  N/A       Number of Children:  3   .  Years of Education:  N/A       Occupational History   .  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  Smoking status:  Never Smoker    .  Smokeless tobacco:  Not on file   .  Alcohol Use:  No   .  Drug Use:  No   .  Sexually Active:  Not on file       Other Topics  Concern   .  Not on file       Social History Narrative   .  No narrative on file      Review of Systems: Gen: Denies any fever, chills, fatigue, weight loss, lack of appetite.   CV: Denies chest pain, heart palpitations, peripheral edema, syncope.   Resp: Denies shortness of breath at rest or with exertion. Denies wheezing or cough.   GI: Denies  odynophagia. Denies jaundice, hematemesis, fecal incontinence. GU : Denies urinary burning, urinary frequency, urinary hesitancy MS: Denies joint pain, muscle weakness, cramps, or limitation of movement.   Derm: Denies rash, itching, dry skin Psych: Denies depression, anxiety, memory loss, and confusion Heme: Denies bruising, bleeding, and enlarged lymph nodes.   Physical Exam: BP 148/79  Pulse 60  Temp(Src) 97.4 F (36.3 C) (Temporal)  Ht 5\' 6"  (1.676 m)  Wt 190 lb (86.183 kg)  BMI 30.67 kg/m2 General:   Alert and oriented. Pleasant and cooperative. Well-nourished and well-developed.   Head:  Normocephalic and atraumatic. Eyes:  Without icterus, sclera clear and conjunctiva pink.   Ears:  Normal auditory acuity. Nose:  No deformity, discharge,  or lesions. Mouth:  No deformity or lesions, oral mucosa pink.   Neck:  Supple, without mass or thyromegaly. Lungs:   Clear to auscultation bilaterally. No wheezes, rales, or rhonchi. No distress.   Heart:  S1, S2 present without murmurs appreciated.   Abdomen:  +BS, soft, mildly tender to palpation epigastric/LUQ. non-distended. No HSM noted. No guarding or rebound. No masses appreciated.   Rectal:  Mild amount of redness perirectally consistent with irritation, small hemorrhoidal tag, good sphincter tone, no Harvie blood noted Msk:  Symmetrical without Barcelona deformities. Normal posture. Extremities:  Without clubbing or edema. Neurologic:  Alert and  oriented x4;  grossly normal neurologically. Skin:  Intact without significant lesions or rashes. Cervical Nodes:  No significant cervical adenopathy. Psych:  Alert and cooperative. Normal mood and affect.         Glendora Score  09/14/2011  9:06 AM  Signed Cc to PCP  Cloria Spring, LPN, LPN  0/45/4098  8:17 AM  Signed Quick Note:   Pt informed. Called and spoke with Ginger at Dr. Renard Matter office. She is aware and will be expecting the fax. Routed to Soledad Gerlach to fax. ______  Glendora Score  09/15/2011 10:47 AM  Signed Results Cc to PCP  Cloria Spring, LPN, LPN  01/13/1477  1:54 PM  Signed Quick Note:   Pt informed. Has appt with PCP Wed this week. Says diarrhea is much better. ______  Gerrit Halls, NP  09/20/2011 10:27 AM  Signed Nino Parsley Note:   Glad to hear. ______  Jonette Eva, MD  09/22/2011 12:04 PM  Signed Labs reviewed/ should see pcp RE: tsh.   REVIEWED. AGREE.        LUQ pain - Gerrit Halls, NP  09/13/2011 11:50 PM  Signed 65 year old with LUQ pain X 2 weeks, worsened with movement, relieved by laying/sitting. +nausea intermittently, no vomiting. No NSAIDs, aspirin powders. CT unrevealing. Intermittent dysphagia. Question of musculoskeletal etiology; however, unable to rule out GI process at this  time. With dysphagia, will need to pursue EGD for further assessment.    Proceed with upper endoscopy, possible dilation in the near future with  Dr. Darrick Penna. The risks, benefits, and alternatives have been discussed in detail with patient. They have stated understanding and desire to proceed.   Continue Protonix     Dysphagia - Gerrit Halls, NP  09/13/2011 11:52 PM  Signed Addressed. See notes under LUQ. ?uncontrolled GERD, web, ring, or stricture.    Diarrhea - Gerrit Halls, NP  09/13/2011 11:54 PM  Signed Loose stools X 2 weeks. Intermittent BRBPR. Recent colonoscopy by Dr. Lovell Sheehan, reports requested. Rectal exam with external hemorrhoidal tag. Likely benign anorectal source, internal hemorrhoids. Will treat with Anusol suppositories, obtain TSH, stool studies.   TSH Cdiff PCR, Culture, Giardia, lactoferrin Obtain op notes

## 2011-09-28 NOTE — Interval H&P Note (Signed)
History and Physical Interval Note:   09/28/2011   10:08 AM   [name]  has presented today for surgery, with the diagnosis of LUQ pain, dysphagia  The various methods of treatment have been discussed with the patient and family. After consideration of risks, benefits and other options for treatment, the patient has consented to  Procedure(s): ESOPHAGOGASTRODUODENOSCOPY (EGD) WITH ESOPHAGEAL DILATION as a surgical intervention .  I have reviewed the patients' chart and labs.  Questions were answered to the patient's satisfaction.     Jonette Eva  MD

## 2011-10-04 ENCOUNTER — Telehealth: Payer: Self-pay | Admitting: Gastroenterology

## 2011-10-04 NOTE — Telephone Encounter (Signed)
Pt informed

## 2011-10-04 NOTE — Telephone Encounter (Signed)
Reminder in epic to follow up in Jan 2013

## 2011-10-04 NOTE — Telephone Encounter (Signed)
Please call pt. HER stomach Bx shows mild gastritis AND BENIGN STOMACH POLYPS. Continue PROTONIX 30 minutes prior to meals. OPV IN JAN 2013.

## 2011-10-24 NOTE — Telephone Encounter (Signed)
Results Cc to PCP  

## 2011-12-14 ENCOUNTER — Encounter: Payer: Self-pay | Admitting: Gastroenterology

## 2012-01-09 DIAGNOSIS — Z23 Encounter for immunization: Secondary | ICD-10-CM | POA: Diagnosis not present

## 2012-03-20 ENCOUNTER — Encounter: Payer: Self-pay | Admitting: Gastroenterology

## 2012-03-21 ENCOUNTER — Ambulatory Visit (INDEPENDENT_AMBULATORY_CARE_PROVIDER_SITE_OTHER): Payer: Medicare Other | Admitting: Gastroenterology

## 2012-03-21 ENCOUNTER — Encounter: Payer: Self-pay | Admitting: Gastroenterology

## 2012-03-21 VITALS — BP 145/80 | HR 63 | Temp 98.4°F | Ht 66.0 in | Wt 197.8 lb

## 2012-03-21 DIAGNOSIS — R197 Diarrhea, unspecified: Secondary | ICD-10-CM

## 2012-03-21 DIAGNOSIS — R1310 Dysphagia, unspecified: Secondary | ICD-10-CM | POA: Insufficient documentation

## 2012-03-21 DIAGNOSIS — R1012 Left upper quadrant pain: Secondary | ICD-10-CM

## 2012-03-21 NOTE — Progress Notes (Signed)
Referring Provider: Alice Reichert, MD Primary Care Physician:  Alice Reichert, MD, MD Primary Gastroenterologist: Dr. Darrick Penna   Chief Complaint  Patient presents with  . Abdominal Pain    left side    HPI:   Returns today in f/u, hx of LUQ pain noted at Sept 2012 visit as well as dysphagia, prompting EGD. Distal esophageal stricture dilated, mild gastritis noted, small benign gastric polyps. Her discomfort at that time was mainly related to movement. Last CT in Sept 2012 with descending colonic and sigmoid diverticulosis. No diverticulitis.  Today denies dysphagia, reflux controlled. Continued LUQ pain, intermittent waxing and waning. Described as "hurting" really bad, associated with swelling of affected area. Not related to eating/drinking. Feels "woozy" with pain, slight nausea. Pain not associated with constipation. Denies diarrhea. BM once a day. Occasional bloating if hasn't had a good BM. Denies fever. Stays "cold" all the time.   Wt 197, up 7 lbs.   Past Medical History  Diagnosis Date  . Hypothyroidism   . GERD (gastroesophageal reflux disease)   . Hypercholesterolemia     can't take meds, affects LFTs  . S/P colonoscopy Oct 2011    Dr. Lovell Sheehan: mild diverticulosis in sigmoid, otherwise normal    Past Surgical History  Procedure Date  . Total abdominal hysterectomy   . Cholecystectomy   . Tubal ligation   . Thyroid surgery 2008    left  thyroid removed  . Appendectomy   . Esophagogastroduodenoscopy 09/28/11    mild gastritis/stricture in the distal esophagus/small gastric polyps benign    Current Outpatient Prescriptions  Medication Sig Dispense Refill  . levothyroxine (SYNTHROID, LEVOTHROID) 88 MCG tablet Take 75 mcg by mouth daily.       . pantoprazole (PROTONIX) 40 MG tablet Take 40 mg by mouth 2 (two) times daily.       . simvastatin (ZOCOR) 20 MG tablet Take 20 mg by mouth daily.        . Red Yeast Rice Extract (RED YEAST RICE PO) Take 2 tablets by mouth  daily.          Allergies as of 03/21/2012  . (No Known Allergies)    Family History  Problem Relation Age of Onset  . Colon cancer Mother     in remission, diagnosed age 93  . Anesthesia problems Neg Hx   . Hypotension Neg Hx   . Malignant hyperthermia Neg Hx   . Pseudochol deficiency Neg Hx     History   Social History  . Marital Status: Widowed    Spouse Name: N/A    Number of Children: 3  . Years of Education: N/A   Occupational History  . retired     Games developer    Social History Main Topics  . Smoking status: Never Smoker   . Smokeless tobacco: None  . Alcohol Use: No  . Drug Use: No  . Sexually Active: None   Other Topics Concern  . None   Social History Narrative  . None    Review of Systems: Gen: Denies fever, chills, anorexia. Denies fatigue, weakness, weight loss.  CV: Denies chest pain, palpitations, syncope, peripheral edema, and claudication. Resp: Denies dyspnea at rest, cough, wheezing, coughing up blood, and pleurisy. GI: Denies vomiting blood, jaundice, and fecal incontinence.   Denies dysphagia or odynophagia. Derm: Denies rash, itching, dry skin Psych: Denies depression, anxiety, memory loss, confusion. No homicidal or suicidal ideation.  Heme: Denies bruising, bleeding, and enlarged lymph nodes.  Physical Exam:  BP 145/80  Pulse 63  Temp(Src) 98.4 F (36.9 C) (Temporal)  Ht 5\' 6"  (1.676 m)  Wt 197 lb 12.8 oz (89.721 kg)  BMI 31.93 kg/m2 General:   Alert and oriented. No distress noted. Pleasant and cooperative.  Head:  Normocephalic and atraumatic. Eyes:  Conjuctiva clear without scleral icterus. Mouth:  Oral mucosa pink and moist. Good dentition. No lesions. Heart:  S1, S2 present without murmurs, rubs, or gallops. Regular rate and rhythm. Abdomen:  +BS, soft, TTP LUQ and non-distended. No rebound or guarding. No HSM or masses noted. - Carnett's sign. Msk:  Symmetrical without Daniello deformities. Normal posture. Extremities:   Without edema. Neurologic:  Alert and  oriented x4;  grossly normal neurologically. Skin:  Intact without significant lesions or rashes. Psych:  Alert and cooperative. Normal mood and affect.

## 2012-03-21 NOTE — Assessment & Plan Note (Signed)
Resolved

## 2012-03-21 NOTE — Assessment & Plan Note (Addendum)
LUQ pain since December, unable to characterize, not associated with eating/drinking. Mild nausea and "woozy" feeling with discomfort. Notes "swelling" of area with pain. Up 7 lbs from last visit. No significant hx of back issues. She has had multiple abdominal procedures in the past. EGD on file reassuring. Last CT 08/2011 without etiology to explain LUQ pain. Last [place] in 2010. Negative Carnett's sign on physical exam. I don't believe a repeat CT will warrant any additional findings; I wonder if she may be experiencing abdominal pain secondary to adhesive disease. We will obtain baseline labs, and I will discuss with Dr. Darrick Penna the next best course for evaluation. I discussed with pt the possibility of ordering an AAS for her to complete when the pain strikes again to see if we can document any issues at that time. She is agreeable and will wait to hear back from [place] regarding the next step.  In interim: CBC, HFP, Lipase Further recommendations in near future. Return in 6 mos.

## 2012-03-21 NOTE — Patient Instructions (Addendum)
Please have blood work completed. We will be calling you with the results.  I will talk with Dr. Darrick Penna about the next step in assessing your belly pain. We will be in touch with you as soon as possible regarding further work-up.  Continue taking Protonix as prescribed.  We will see you back in 6 months otherwise. Of course, the belly pain will be addressed in the next few days (regarding the next step).

## 2012-03-22 LAB — CBC WITH DIFFERENTIAL/PLATELET
Basophils Absolute: 0 10*3/uL (ref 0.0–0.1)
Basophils Relative: 1 % (ref 0–1)
Eosinophils Relative: 3 % (ref 0–5)
HCT: 40.7 % (ref 36.0–46.0)
Hemoglobin: 12.5 g/dL (ref 12.0–15.0)
Lymphocytes Relative: 22 % (ref 12–46)
MCHC: 30.7 g/dL (ref 30.0–36.0)
MCV: 91.3 fL (ref 78.0–100.0)
Monocytes Absolute: 0.5 10*3/uL (ref 0.1–1.0)
Monocytes Relative: 7 % (ref 3–12)
RDW: 14 % (ref 11.5–15.5)

## 2012-03-22 LAB — HEPATIC FUNCTION PANEL
ALT: 17 U/L (ref 0–35)
AST: 19 U/L (ref 0–37)
Bilirubin, Direct: 0.1 mg/dL (ref 0.0–0.3)
Indirect Bilirubin: 0.2 mg/dL (ref 0.0–0.9)
Total Protein: 6.5 g/dL (ref 6.0–8.3)

## 2012-03-22 NOTE — Progress Notes (Signed)
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  PT MAY TRY A TRIAL OF IMIPRAMINE FOR 6 MOS +/- PT OR SHE CAN SEE HER PCP FOR FURTHER EVALUATION. OUR WORKUP IS COMPLETE.

## 2012-03-22 NOTE — Progress Notes (Signed)
Faxed to PCP

## 2012-03-29 ENCOUNTER — Telehealth: Payer: Self-pay

## 2012-03-29 ENCOUNTER — Other Ambulatory Visit: Payer: Self-pay | Admitting: Gastroenterology

## 2012-03-29 MED ORDER — IMIPRAMINE HCL 25 MG PO TABS: 25.0000 mg | ORAL_TABLET | Freq: Every day | ORAL | Status: DC

## 2012-03-29 NOTE — Progress Notes (Signed)
Noted. Starting imipramine at low dose, titrate up to 50. Return in 2 mos to see SLF.

## 2012-03-29 NOTE — Progress Notes (Signed)
Quick Note:  Pt informed ______ 

## 2012-03-29 NOTE — Telephone Encounter (Signed)
Yes, reviewed with SLF. 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  PT MAY TRY A TRIAL OF IMIPRAMINE FOR 6 MOS +/- PT OR SHE CAN SEE HER PCP FOR FURTHER EVALUATION. OUR WORKUP IS COMPLETE."   I am going to send a prescription for Imipramine to her pharmacy. She will need to come back in 2 months to see how she is doing. This will be titrated slowly up. Needs to review the directions carefully, Also needs to follow-up with her PCP. Imipramine is a medication considered a TCA (tricyclic antidepressant class), but it is used for chronic pain as well.   Will start at low dose 25 mg for 3 days. Increase to 50 mg at day 4. Stay at 50 until returns. May need to titrate up. DO NOT STOP ABRUPTLY.

## 2012-03-29 NOTE — Telephone Encounter (Signed)
Pt called for her lab results from last Wednesday. She also said to let Gerrit Halls, NP, know that she is still having the abdominal pain. Most of the time intermittent, but Saturday and Sunday it hurt all day and it gets bad at times. She said that Tobi Bastos was going to discuss with Dr. Darrick Penna and maybe order some more tests. Please advise!

## 2012-03-29 NOTE — Progress Notes (Signed)
Quick Note:  Everything looks good. Trial of Imipramine, see Dr. Darrick Penna as already outlined in a few months. ______

## 2012-03-29 NOTE — Telephone Encounter (Signed)
Called and informed pt. She is aware that labs are ok also.

## 2012-05-29 ENCOUNTER — Other Ambulatory Visit (HOSPITAL_COMMUNITY): Payer: Self-pay | Admitting: Family Medicine

## 2012-05-29 DIAGNOSIS — Z139 Encounter for screening, unspecified: Secondary | ICD-10-CM

## 2012-06-13 ENCOUNTER — Ambulatory Visit (INDEPENDENT_AMBULATORY_CARE_PROVIDER_SITE_OTHER): Payer: Medicare Other | Admitting: Gastroenterology

## 2012-06-13 ENCOUNTER — Encounter: Payer: Self-pay | Admitting: Gastroenterology

## 2012-06-13 VITALS — BP 134/77 | HR 56 | Temp 97.8°F | Ht 66.0 in | Wt 198.2 lb

## 2012-06-13 DIAGNOSIS — R1012 Left upper quadrant pain: Secondary | ICD-10-CM

## 2012-06-13 MED ORDER — ESCITALOPRAM OXALATE 10 MG PO TABS: ORAL_TABLET | ORAL | Status: DC

## 2012-06-13 NOTE — Patient Instructions (Addendum)
SEE DR. Lovell Sheehan TO CONTINUE YOUR EVALUATION FOR SEVERE LUQ PAIN.  START LEXAPRO 10 MG EVERY MORNING. INCREASE TO 2 PILLS EVERY MORNING AFTER 2 WEEKS. IT MAY CAUSE DIARRHEA, OR NAUSEA.  TAKE A PROBIOTIC DAILY ([facility], [facility], OR ALIGN)  FOLLOW THE NON-ULCER DYSPEPSIA DIET. SEE HANDOUT.  FOLLOW UP IN 3 MOS.

## 2012-06-13 NOTE — Assessment & Plan Note (Signed)
LIKELY DUE TO NON-ULCER DYSPEPSIA, LESS LIKELY HERNIA OR REFERRED PAIN FROM DDD OR OCCULT MALIGNANCY. I PERSONALLY REVIEWED SEP 2012 CT WITH DR. Tyron Russell & NO ABD WALL DEFECT WAS APPRECIATED.   SEE DR. Lovell Sheehan TO CONTINUE YOUR EVALUATION FOR SEVERE LUQ PAIN. CONSIDER EX LAP OR ?HERNIA REPAIR  START LEXAPRO 10 MG EVERY MORNING. INCREASE TO 2 PILLS EVERY MORNING AFTER 2 WEEKS. IT MAY CAUSE DIARRHEA, OR NAUSEA.  TAKE A PROBIOTIC DAILY ([facility], [facility], OR ALIGN). SAMPLES OF ALIGN GIVEN #28.  FOLLOW THE NON-ULCER DYSPEPSIA DIET. HANDOUT GIVEN.  FOLLOW UP IN 3 MOS.

## 2012-06-13 NOTE — Progress Notes (Signed)
  Subjective:    Patient ID: [name], female    DOB: Dec 19, 1946, 66 y.o.   MRN: [id number]  PCP: MCINNIS  HPI STILL HAVING SEVERE LUQ PAIN. PSHx: CHOLECYSTECTOMY/HYSTERECTOMY. HAS HAD A GI WORKUP: [facility]CS 2011, CT NOV 2012 EGD/DIL OCT 2012-GASTRITIS. TRIED IMIPRAMINE BUT FELT IT DIDN'T HELP. GOT UP TO 3 PILLS A DAY, BUT TOOK IT FOR 4-6 WEEKS. DOESN'T CHANGE WITH BMs. Bm: Q2 DAYS.THINKS SHE HAS A BULGE IN HER LUQ. NO PROBIOTIC. REPORTS SHE'S NEVER HAD A BIG APPETITE. GAINED 8 LBS SINCE LAST VISIT. CAN HAVE DIARRHEA IF IT'S CERTAIN THINGS.   NOT INTERESTED IN A SECOND OPINION AT [facility]  OR GOING TO A PAIN SPECIALIST. PAIN NOT RELATED TO EATING/DRINKING. NO PROBLEMS SWALLOWING. HEARTBURN: CONTROLLED. Clears her throat a lot.   Past Medical History  Diagnosis Date  . Hypothyroidism   . GERD (gastroesophageal reflux disease)   . Hypercholesterolemia     can't take meds, affects LFTs  . S/P colonoscopy Oct 2011    Dr. Lovell Sheehan: mild diverticulosis in sigmoid, otherwise normal    Past Surgical History  Procedure Date  . Total abdominal hysterectomy   . Cholecystectomy   . Tubal ligation   . Thyroid surgery 2008    left  thyroid removed  . Appendectomy   . Esophagogastroduodenoscopy 09/28/11    mild gastritis/stricture in the distal esophagus/small gastric polyps benign   No Known Allergies  Current Outpatient Prescriptions  Medication Sig Dispense Refill  . levothyroxine (SYNTHROID, LEVOTHROID) 88 MCG tablet Take 75 mcg by mouth daily.       . pantoprazole (PROTONIX) 40 MG tablet Take 40 mg by mouth 2 (two) times daily.       . Red Yeast Rice Extract (RED YEAST RICE PO) Take 2 tablets by mouth daily.        . simvastatin (ZOCOR) 20 MG tablet Take 20 mg by mouth daily.            Review of Systems 190 LBS SEP 2012    Objective:   Physical Exam  Vitals reviewed. Constitutional: She is oriented to person, place, and time. She appears well-developed. No distress.  HENT:  Head:  Normocephalic and atraumatic.  Mouth/Throat: Oropharynx is clear and moist. No oropharyngeal exudate.  Eyes: Pupils are equal, round, and reactive to light. No scleral icterus.  Neck: Normal range of motion. Neck supple.  Cardiovascular: Normal rate, regular rhythm and normal heart sounds.   Pulmonary/Chest: Effort normal and breath sounds normal. No respiratory distress.  Abdominal: Bowel sounds are normal. She exhibits no distension. There is Tenderness: MILD TO MOD TTP IN LUQ, MILD PROMINENECE IN LUQ NOT APPRECIATED IN THE RUQ, RUQ INCISION WELL-HEALED..  Musculoskeletal: Normal range of motion. She exhibits no edema.  Lymphadenopathy:    She has no cervical adenopathy.  Neurological: She is alert and oriented to person, place, and time.       NO FOCAL DEFICITS   Psychiatric:       FLAT AFFECT, NL MOOD          Assessment & Plan:

## 2012-06-14 NOTE — Progress Notes (Signed)
Pt's appt with Dr. Lovell Sheehan is for 06/19/2012 @ 2:15 PM. Pt is aware.

## 2012-06-14 NOTE — Progress Notes (Signed)
Faxed to PCP

## 2012-06-18 ENCOUNTER — Ambulatory Visit (HOSPITAL_COMMUNITY)
Admission: RE | Admit: 2012-06-18 | Discharge: 2012-06-18 | Disposition: A | Discharge status: Home / Self Care | Payer: Medicare Other | Source: Ambulatory Visit | Attending: Family Medicine | Admitting: Family Medicine

## 2012-06-18 DIAGNOSIS — Z1231 Encounter for screening mammogram for malignant neoplasm of breast: Secondary | ICD-10-CM | POA: Diagnosis not present

## 2012-06-18 DIAGNOSIS — Z139 Encounter for screening, unspecified: Secondary | ICD-10-CM

## 2012-06-19 ENCOUNTER — Other Ambulatory Visit (HOSPITAL_COMMUNITY): Payer: Self-pay | Admitting: General Surgery

## 2012-06-19 DIAGNOSIS — R1012 Left upper quadrant pain: Secondary | ICD-10-CM

## 2012-06-20 ENCOUNTER — Other Ambulatory Visit (HOSPITAL_COMMUNITY): Payer: Medicare Other

## 2012-06-21 ENCOUNTER — Other Ambulatory Visit (HOSPITAL_COMMUNITY): Payer: Medicare Other

## 2012-06-22 DIAGNOSIS — Z0000 Encounter for general adult medical examination without abnormal findings: Secondary | ICD-10-CM | POA: Diagnosis not present

## 2012-06-22 DIAGNOSIS — E039 Hypothyroidism, unspecified: Secondary | ICD-10-CM | POA: Diagnosis not present

## 2012-06-22 DIAGNOSIS — E785 Hyperlipidemia, unspecified: Secondary | ICD-10-CM | POA: Diagnosis not present

## 2012-06-22 DIAGNOSIS — Z79899 Other long term (current) drug therapy: Secondary | ICD-10-CM | POA: Diagnosis not present

## 2012-06-25 ENCOUNTER — Ambulatory Visit (HOSPITAL_COMMUNITY)
Admission: RE | Admit: 2012-06-25 | Discharge: 2012-06-25 | Disposition: A | Discharge status: Home / Self Care | Payer: Medicare Other | Source: Ambulatory Visit | Attending: General Surgery | Admitting: General Surgery

## 2012-06-25 ENCOUNTER — Encounter (HOSPITAL_COMMUNITY): Payer: Self-pay

## 2012-06-25 DIAGNOSIS — R1032 Left lower quadrant pain: Secondary | ICD-10-CM | POA: Diagnosis not present

## 2012-06-25 DIAGNOSIS — K3189 Other diseases of stomach and duodenum: Secondary | ICD-10-CM | POA: Diagnosis not present

## 2012-06-25 DIAGNOSIS — R1012 Left upper quadrant pain: Secondary | ICD-10-CM

## 2012-06-25 NOTE — Progress Notes (Signed)
Reminder in epic to follow up in 3 months with SF in E30 

## 2012-07-03 ENCOUNTER — Telehealth: Payer: Self-pay

## 2012-07-03 NOTE — Telephone Encounter (Signed)
Dr Nena Polio you willing to see this patient?

## 2012-07-03 NOTE — Telephone Encounter (Signed)
Pt called this morning wanting to change her appointment on Aug the 7 with SLF to RMR. I told her that they do not see each other Pt's unless there are on call. She said that she would see what happen at the office visit but she might have to change doctors.

## 2012-07-03 NOTE — Telephone Encounter (Signed)
REVIEWED.  

## 2012-07-04 NOTE — Telephone Encounter (Signed)
Noted  

## 2012-07-04 NOTE — Telephone Encounter (Signed)
I don't believe I can add anything to Dr. Darrick Penna' excellent evaluation thus far. If anything, patient should consider second opinion at a tertiary center as offered by Dr.  Darrick Penna previously. So, no, I do not feel that my seeing the patient would be productive.

## 2012-08-01 ENCOUNTER — Ambulatory Visit (INDEPENDENT_AMBULATORY_CARE_PROVIDER_SITE_OTHER): Payer: Medicare Other | Admitting: Gastroenterology

## 2012-08-01 ENCOUNTER — Encounter: Payer: Self-pay | Admitting: Gastroenterology

## 2012-08-01 VITALS — BP 134/77 | HR 55 | Temp 98.2°F | Ht 66.0 in | Wt 197.0 lb

## 2012-08-01 DIAGNOSIS — R1012 Left upper quadrant pain: Secondary | ICD-10-CM | POA: Diagnosis not present

## 2012-08-01 NOTE — Progress Notes (Signed)
  Subjective:    Patient ID: [name], female    DOB: 1946/07/14, 66 y.o.   MRN: [id number]  PCP: MCINNIS  HPI  PT LAST SEEN JUN 2013-HPI STILL HAVING SEVERE LUQ PAIN AFTER SHE GETS UP AMND MOVES AROUND. 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  LAST VISIT TRIED IMIPRAMINE BUT FELT IT DIDN'T HELP. GOT UP TO 3 PILLS A DAY, BUT TOOK IT FOR 4-6 WEEKS. STILL DOESN'T CHANGE WITH BMs. SHE HAD THE BULGE IN HER LUQ EVALUATED BY DR. Lovell Sheehan BUT HE SAID SHE DID NOT HAVE A HERNIA. PROBIOTIC DIDN'T HELP. STARTED LEXAPRO BUT HASN'T NOTICED A DIFFERENCE AT 20 MG QD.  REPORTS SHE'S NEVER HAD A BIG APPETITE. LOST 1 LB SINCE LAST VISIT. BMI IS STILL > 30. PAIN STILL NOT RELATED TO EATING/DRINKING. NO PROBLEMS SWALLOWING. HEARTBURN: CONTROLLED.   PSHx: CHOLECYSTECTOMY/HYSTERECTOMY. HAS HAD A GI WORKUP: TCS 2011, CT NOV 2012 EGD/DIL OCT 2012-GASTRITIS.   Past Medical History  Diagnosis Date  . Hypothyroidism   . GERD (gastroesophageal reflux disease)   . Hypercholesterolemia     can't take meds, affects LFTs  . S/P colonoscopy Oct 2011    Dr. Lovell Sheehan: mild diverticulosis in sigmoid, otherwise normal    Past Surgical History  Procedure Date  . Total abdominal hysterectomy   . Cholecystectomy   . Tubal ligation   . Thyroid surgery 2008    left  thyroid removed  . Appendectomy   . Esophagogastroduodenoscopy 09/28/11    mild gastritis/stricture in the distal esophagus/small gastric polyps benign    No Known Allergies  Current Outpatient Prescriptions  Medication Sig Dispense Refill  . escitalopram (LEXAPRO) 10 MG tablet  2 PO EVERY MORNING    . levothyroxine (SYNTHROID, LEVOTHROID) 88 MCG tablet Take 75 mcg by mouth daily.       . pantoprazole (PROTONIX) 40 MG tablet Take 40 mg by mouth 2 (two) times daily.       . Red Yeast Rice Extract (RED YEAST RICE PO) Take 2 tablets by mouth daily.        . simvastatin (ZOCOR) 20 MG tablet Take 20 mg by mouth daily.            Review of  Systems     Objective:   Physical Exam  Vitals reviewed. Constitutional: She is oriented to person, place, and time. She appears well-developed. No distress.  HENT:  Head: Normocephalic and atraumatic.  Mouth/Throat: Oropharynx is clear and moist. No oropharyngeal exudate.  Eyes: Pupils are equal, round, and reactive to light. No scleral icterus.  Neck: Normal range of motion. Neck supple.  Cardiovascular: Normal rate, regular rhythm and normal heart sounds.   Pulmonary/Chest: Effort normal and breath sounds normal. No respiratory distress.  Abdominal: Soft. Bowel sounds are normal. She exhibits no distension. There is tenderness (MOD TTP IN LUQ IN AREA OF RECTUS SHEATH ). There is no rebound and no guarding.  Musculoskeletal: She exhibits no edema.  Lymphadenopathy:    She has no cervical adenopathy.  Neurological: She is alert and oriented to person, place, and time.  Psychiatric: She has a normal mood and affect.          Assessment & Plan:

## 2012-08-01 NOTE — Progress Notes (Signed)
Faxed to PCP

## 2012-08-01 NOTE — Assessment & Plan Note (Addendum)
MOST LIKELY MUSCULOSKELETAL ABD WALL PAIN.Marland Kitchen   PT/IONTOPHORESIS & REFER TO THE CHIROPRACTOR. PT WILL DISCUSS WITH PCP. TAPER LEXAPRO AFTER OCT 1 IF SX NOT IMPROVED. PT INSTRUCTED NOT TO STOP LEXAPRO ALL AT ONCE.  OPV IN 6 MOS.

## 2012-08-01 NOTE — Patient Instructions (Addendum)
I BELIEVE YOU HAVE MUSCULOSKELETAL ABDOMINAL WALL PAIN.   I THINK YOU WILL GET BETTER WITH PHYSICAL THERAPY & SEEING A CHIROPRACTOR(DR. TAYLOR [phone], [address], [place]).  SEE WHAT DR. MCINNIS SAYS.  CONTINUE LEXAPRO UNTIL OCT 1. IF SYMPTOMS DO NOT GET BETTER, REDUCE DOSE TO ONE PILL DAILY FOR 7 DAYS, THEN 1 PILL EVERY OTHER DAY FOR 7 DAYS, THEN 1 PILL ON 10/16 & 10/18 & STOP.  FOLLOW UP IN 6 MOS. CALL ME SOONER IF ANYTHING  CHANGES.

## 2012-08-02 ENCOUNTER — Telehealth: Payer: Self-pay | Admitting: Gastroenterology

## 2012-08-02 NOTE — Progress Notes (Signed)
Reminder in epic to follow up in 6 months with SF in E30 °

## 2012-08-02 NOTE — Telephone Encounter (Signed)
PT CHANGING GI DOCS. TRANSFER RECORDS. CANCEL FUTURE APPTS.

## 2012-08-02 NOTE — Telephone Encounter (Signed)
Pt was seen yesterday by [facility] and patient calls today asking for [name] to transfer her records to Dr Norval Gable office. I told her that she would need to stop by the office to sign a release of information before we could do that. Patient agreed and will come by.

## 2012-08-03 ENCOUNTER — Telehealth: Payer: Self-pay | Admitting: Gastroenterology

## 2012-08-03 NOTE — Telephone Encounter (Signed)
We Received a fax from PT at [facility] and they tried to make appointment for [name] and she declined the appointment she stated that she had another appointment with another Dr.

## 2012-08-03 NOTE — Telephone Encounter (Signed)
done

## 2012-08-08 NOTE — Telephone Encounter (Signed)
PT NO LONGER A [facility] PT. PLEASE NOT IN THE SYSTEM.WATSIN

## 2012-08-29 DIAGNOSIS — Z23 Encounter for immunization: Secondary | ICD-10-CM | POA: Diagnosis not present

## 2012-10-09 DIAGNOSIS — J069 Acute upper respiratory infection, unspecified: Secondary | ICD-10-CM | POA: Diagnosis not present

## 2012-10-09 DIAGNOSIS — J209 Acute bronchitis, unspecified: Secondary | ICD-10-CM | POA: Diagnosis not present

## 2012-12-24 ENCOUNTER — Encounter: Payer: Self-pay | Admitting: *Deleted

## 2013-02-06 DIAGNOSIS — Z79899 Other long term (current) drug therapy: Secondary | ICD-10-CM | POA: Diagnosis not present

## 2013-02-06 DIAGNOSIS — E785 Hyperlipidemia, unspecified: Secondary | ICD-10-CM | POA: Diagnosis not present

## 2013-02-06 DIAGNOSIS — E039 Hypothyroidism, unspecified: Secondary | ICD-10-CM | POA: Diagnosis not present

## 2013-02-06 DIAGNOSIS — G479 Sleep disorder, unspecified: Secondary | ICD-10-CM | POA: Diagnosis not present

## 2013-02-08 ENCOUNTER — Other Ambulatory Visit (HOSPITAL_COMMUNITY): Payer: Self-pay

## 2013-02-08 DIAGNOSIS — G4700 Insomnia, unspecified: Secondary | ICD-10-CM

## 2013-02-12 ENCOUNTER — Ambulatory Visit: Payer: Medicare Other | Attending: Family Medicine | Admitting: Sleep Medicine

## 2013-02-12 VITALS — Ht 66.0 in | Wt 170.0 lb

## 2013-02-12 DIAGNOSIS — G4710 Hypersomnia, unspecified: Secondary | ICD-10-CM | POA: Diagnosis not present

## 2013-02-12 DIAGNOSIS — G4700 Insomnia, unspecified: Secondary | ICD-10-CM

## 2013-02-12 DIAGNOSIS — G4730 Sleep apnea, unspecified: Secondary | ICD-10-CM | POA: Diagnosis not present

## 2013-02-12 DIAGNOSIS — G4733 Obstructive sleep apnea (adult) (pediatric): Secondary | ICD-10-CM | POA: Diagnosis not present

## 2013-02-14 NOTE — Progress Notes (Signed)
Lab split night protocol not met due to long sleep latency. Pt exhibited very fragmented sleep, microsleep, with frequent arousals

## 2013-02-15 NOTE — Procedures (Signed)
[facility] Manolo Bosket A. Gerilyn Pilgrim, MD     [email or web address]        NAMEJAZMEEN, [name]                 ACCOUNT NO.:  [id number]  MEDICAL RECORD NO.:  [id number]          PATIENT TYPE:  OUT  LOCATION:  SLEEP LAB                     FACILITY:  [facility]  PHYSICIAN:  Jenetta Wease A. Gerilyn Pilgrim, M.D. DATE OF BIRTH:  Nov 01, 1946  DATE OF STUDY:  02/12/2013                           NOCTURNAL POLYSOMNOGRAM  REFERRING PHYSICIAN:  Angus G. McInnis, MD  INDICATIONS:  A 67 year old, who presents with fatigue, snoring, and witnessed apnea.  MEDICATIONS:  Protonix, simvastatin, levothyroxine.  Epworth Sleepiness Scale 2.  BMI 27.  ARCHITECTURAL SUMMARY:  The total recording time is 430 minutes, sleep efficiency 54%, sleep latency 56 minutes.  REM latency 266 minutes. Stage N1 21%, N2 72%, N3 0%, and REM sleep 6%.  RESPIRATORY SUMMARY:  Baseline oxygen saturation is 94, lowest saturation 77 during REM sleep.  The diagnostic AHI is 15 and RDI 30.  LIMB MOVEMENT SUMMARY:  PLM index 0.  ELECTROCARDIOGRAM SUMMARY:  Average heart rate is 60 with no significant dysrhythmia was observed.  IMPRESSION:  Moderate obstructive sleep apnea syndrome.  RECOMMENDATION:  Formal CPAP titration study.  Thanks for this referral.    Rebeccah Ivins A. Gerilyn Pilgrim, M.D.    KAD/MEDQ  D:  02/15/2013 10:22:54  T:  02/15/2013 10:30:58  Job:  [id number]

## 2013-02-27 ENCOUNTER — Other Ambulatory Visit: Payer: Self-pay

## 2013-02-27 DIAGNOSIS — G4700 Insomnia, unspecified: Secondary | ICD-10-CM

## 2013-03-06 ENCOUNTER — Ambulatory Visit: Payer: Medicare Other | Attending: Family Medicine | Admitting: Sleep Medicine

## 2013-03-06 VITALS — Ht 66.0 in | Wt 170.0 lb

## 2013-03-06 DIAGNOSIS — G4730 Sleep apnea, unspecified: Secondary | ICD-10-CM | POA: Diagnosis not present

## 2013-03-06 DIAGNOSIS — G4733 Obstructive sleep apnea (adult) (pediatric): Secondary | ICD-10-CM | POA: Diagnosis not present

## 2013-03-06 DIAGNOSIS — G4710 Hypersomnia, unspecified: Secondary | ICD-10-CM | POA: Diagnosis not present

## 2013-03-06 DIAGNOSIS — G4700 Insomnia, unspecified: Secondary | ICD-10-CM

## 2013-03-20 NOTE — Procedures (Signed)
[facility] [name] A. Gerilyn Pilgrim, MD     [email or web address]        NAMECHELLSEA, [name]                 ACCOUNT NO.:  [id number]  MEDICAL RECORD NO.:  [id number]          PATIENT TYPE:  OUT  LOCATION:  SLEEP LAB                     FACILITY:  [facility]  PHYSICIAN:  Trysten Berti A. Gerilyn Pilgrim, M.D. DATE OF BIRTH:  05/29/1946  DATE OF STUDY:  03/06/2013                           NOCTURNAL POLYSOMNOGRAM  REFERRING PHYSICIAN:  Angus G. McInnis, MD  INDICATION FOR STUDY:  A 67 year old lady who has a known history of obstructive sleep apnea syndrome, previously documented with a nocturnal polysomnography.  This study is a CPAP titration recording.  EPWORTH SLEEPINESS SCORE:  2.  BMI 27.  MEDICATIONS:  Simvastatin, Protonix, levothyroxine.  SLEEP ARCHITECTURE:  This is a full night CPAP titration recording.  The total recording time is 376 minutes.  Sleep efficiency 72%.  Sleep latency 71 minutes.  REM latency calculated by computer is 17, but recalculated manually as 78.5.  Stage N1 5%, N2 68%, N3 4%, and REM sleep 23%.  RESPIRATORY DATA:  Baseline oxygen saturation is 93, lowest saturation 81 during non-REM sleep.  The patient was placed on positive pressure and titrated between pressures of 5 and 12.  The optimal pressure is 10, although she could use 11 or 12, the least effective pressure suggested. The patient did have some nonobstructing desaturations with eye movements during REM sleep.  CARDIAC DATA:  Average heart rate is 52 with no significant dysrhythmias observed.  MOVEMENT-PARASOMNIA:  PLM index 0.  IMPRESSIONS-RECOMMENDATIONS:  Obstructive sleep apnea syndrome which responds well to a CPAP of 10.  Thanks for this referral.     Carnelius Hammitt A. Gerilyn Pilgrim, M.D.    KAD/MEDQ  D:  03/20/2013 09:35:17  T:  03/20/2013 09:50:37  Job:  [id number]

## 2013-04-08 DIAGNOSIS — G4730 Sleep apnea, unspecified: Secondary | ICD-10-CM | POA: Diagnosis not present

## 2013-04-08 DIAGNOSIS — G4700 Insomnia, unspecified: Secondary | ICD-10-CM | POA: Diagnosis not present

## 2013-05-08 DIAGNOSIS — G4700 Insomnia, unspecified: Secondary | ICD-10-CM | POA: Diagnosis not present

## 2013-05-08 DIAGNOSIS — G4730 Sleep apnea, unspecified: Secondary | ICD-10-CM | POA: Diagnosis not present

## 2013-06-04 ENCOUNTER — Other Ambulatory Visit (HOSPITAL_COMMUNITY): Payer: Self-pay | Admitting: Family Medicine

## 2013-06-04 DIAGNOSIS — Z139 Encounter for screening, unspecified: Secondary | ICD-10-CM

## 2013-06-19 DIAGNOSIS — F411 Generalized anxiety disorder: Secondary | ICD-10-CM | POA: Diagnosis not present

## 2013-06-19 DIAGNOSIS — G4730 Sleep apnea, unspecified: Secondary | ICD-10-CM | POA: Diagnosis not present

## 2013-06-19 DIAGNOSIS — F329 Major depressive disorder, single episode, unspecified: Secondary | ICD-10-CM | POA: Diagnosis not present

## 2013-06-21 ENCOUNTER — Ambulatory Visit (HOSPITAL_COMMUNITY)
Admission: RE | Admit: 2013-06-21 | Discharge: 2013-06-21 | Disposition: A | Discharge status: Home / Self Care | Payer: Medicare Other | Source: Ambulatory Visit | Attending: Family Medicine | Admitting: Family Medicine

## 2013-06-21 DIAGNOSIS — Z1231 Encounter for screening mammogram for malignant neoplasm of breast: Secondary | ICD-10-CM | POA: Diagnosis not present

## 2013-06-21 DIAGNOSIS — Z139 Encounter for screening, unspecified: Secondary | ICD-10-CM

## 2013-07-09 DIAGNOSIS — K219 Gastro-esophageal reflux disease without esophagitis: Secondary | ICD-10-CM | POA: Diagnosis not present

## 2013-07-29 DIAGNOSIS — E785 Hyperlipidemia, unspecified: Secondary | ICD-10-CM | POA: Diagnosis not present

## 2013-07-29 DIAGNOSIS — E039 Hypothyroidism, unspecified: Secondary | ICD-10-CM | POA: Diagnosis not present

## 2013-07-29 DIAGNOSIS — Z79899 Other long term (current) drug therapy: Secondary | ICD-10-CM | POA: Diagnosis not present

## 2013-08-27 DIAGNOSIS — G4730 Sleep apnea, unspecified: Secondary | ICD-10-CM | POA: Diagnosis not present

## 2013-08-27 DIAGNOSIS — M129 Arthropathy, unspecified: Secondary | ICD-10-CM | POA: Diagnosis not present

## 2013-08-27 DIAGNOSIS — E039 Hypothyroidism, unspecified: Secondary | ICD-10-CM | POA: Diagnosis not present

## 2013-08-27 DIAGNOSIS — E785 Hyperlipidemia, unspecified: Secondary | ICD-10-CM | POA: Diagnosis not present

## 2013-09-10 DIAGNOSIS — Z23 Encounter for immunization: Secondary | ICD-10-CM | POA: Diagnosis not present

## 2013-11-04 ENCOUNTER — Ambulatory Visit (HOSPITAL_COMMUNITY)
Admission: RE | Admit: 2013-11-04 | Discharge: 2013-11-04 | Disposition: A | Discharge status: Home / Self Care | Payer: Medicare Other | Source: Ambulatory Visit | Attending: Family Medicine | Admitting: Family Medicine

## 2013-11-04 ENCOUNTER — Other Ambulatory Visit (HOSPITAL_COMMUNITY): Payer: Self-pay | Admitting: Family Medicine

## 2013-11-04 DIAGNOSIS — R079 Chest pain, unspecified: Secondary | ICD-10-CM

## 2013-11-04 DIAGNOSIS — I517 Cardiomegaly: Secondary | ICD-10-CM | POA: Insufficient documentation

## 2013-11-04 DIAGNOSIS — M542 Cervicalgia: Secondary | ICD-10-CM | POA: Insufficient documentation

## 2013-11-04 DIAGNOSIS — IMO0002 Reserved for concepts with insufficient information to code with codable children: Secondary | ICD-10-CM | POA: Diagnosis not present

## 2013-11-04 DIAGNOSIS — E039 Hypothyroidism, unspecified: Secondary | ICD-10-CM | POA: Diagnosis not present

## 2013-11-04 DIAGNOSIS — M25512 Pain in left shoulder: Secondary | ICD-10-CM

## 2013-11-04 DIAGNOSIS — M25519 Pain in unspecified shoulder: Secondary | ICD-10-CM | POA: Insufficient documentation

## 2014-03-10 DIAGNOSIS — IMO0001 Reserved for inherently not codable concepts without codable children: Secondary | ICD-10-CM | POA: Diagnosis not present

## 2014-03-10 DIAGNOSIS — G4730 Sleep apnea, unspecified: Secondary | ICD-10-CM | POA: Diagnosis not present

## 2014-03-10 DIAGNOSIS — F329 Major depressive disorder, single episode, unspecified: Secondary | ICD-10-CM | POA: Diagnosis not present

## 2014-03-10 DIAGNOSIS — F3289 Other specified depressive episodes: Secondary | ICD-10-CM | POA: Diagnosis not present

## 2014-03-10 DIAGNOSIS — F411 Generalized anxiety disorder: Secondary | ICD-10-CM | POA: Diagnosis not present

## 2014-03-17 DIAGNOSIS — J069 Acute upper respiratory infection, unspecified: Secondary | ICD-10-CM | POA: Diagnosis not present

## 2014-03-17 DIAGNOSIS — J209 Acute bronchitis, unspecified: Secondary | ICD-10-CM | POA: Diagnosis not present

## 2014-04-09 ENCOUNTER — Encounter: Payer: Self-pay | Admitting: Internal Medicine

## 2014-04-10 ENCOUNTER — Encounter: Payer: Self-pay | Admitting: *Deleted

## 2014-04-26 ENCOUNTER — Observation Stay (HOSPITAL_COMMUNITY)
Admission: EM | Admit: 2014-04-26 | Discharge: 2014-04-28 | Disposition: A | Discharge status: Home / Self Care | Payer: Medicare Other | Attending: Family Medicine | Admitting: Family Medicine

## 2014-04-26 ENCOUNTER — Encounter (HOSPITAL_COMMUNITY): Payer: Self-pay | Admitting: Emergency Medicine

## 2014-04-26 ENCOUNTER — Emergency Department (HOSPITAL_COMMUNITY): Payer: Medicare Other

## 2014-04-26 DIAGNOSIS — K222 Esophageal obstruction: Secondary | ICD-10-CM | POA: Insufficient documentation

## 2014-04-26 DIAGNOSIS — E7800 Pure hypercholesterolemia, unspecified: Secondary | ICD-10-CM | POA: Diagnosis not present

## 2014-04-26 DIAGNOSIS — J321 Chronic frontal sinusitis: Secondary | ICD-10-CM | POA: Insufficient documentation

## 2014-04-26 DIAGNOSIS — J320 Chronic maxillary sinusitis: Secondary | ICD-10-CM | POA: Diagnosis not present

## 2014-04-26 DIAGNOSIS — R079 Chest pain, unspecified: Secondary | ICD-10-CM | POA: Diagnosis not present

## 2014-04-26 DIAGNOSIS — Z9989 Dependence on other enabling machines and devices: Secondary | ICD-10-CM

## 2014-04-26 DIAGNOSIS — E039 Hypothyroidism, unspecified: Secondary | ICD-10-CM | POA: Diagnosis not present

## 2014-04-26 DIAGNOSIS — G4733 Obstructive sleep apnea (adult) (pediatric): Secondary | ICD-10-CM | POA: Diagnosis not present

## 2014-04-26 DIAGNOSIS — R0789 Other chest pain: Secondary | ICD-10-CM | POA: Diagnosis not present

## 2014-04-26 DIAGNOSIS — K2990 Gastroduodenitis, unspecified, without bleeding: Secondary | ICD-10-CM

## 2014-04-26 DIAGNOSIS — R51 Headache: Secondary | ICD-10-CM | POA: Diagnosis not present

## 2014-04-26 DIAGNOSIS — K2970 Gastritis, unspecified, without bleeding: Secondary | ICD-10-CM | POA: Insufficient documentation

## 2014-04-26 DIAGNOSIS — K5730 Diverticulosis of large intestine without perforation or abscess without bleeding: Secondary | ICD-10-CM | POA: Diagnosis not present

## 2014-04-26 DIAGNOSIS — R519 Headache, unspecified: Secondary | ICD-10-CM

## 2014-04-26 DIAGNOSIS — R0602 Shortness of breath: Secondary | ICD-10-CM | POA: Diagnosis not present

## 2014-04-26 DIAGNOSIS — K219 Gastro-esophageal reflux disease without esophagitis: Secondary | ICD-10-CM

## 2014-04-26 HISTORY — DX: Obstructive sleep apnea (adult) (pediatric): G47.33

## 2014-04-26 HISTORY — DX: Endocarditis, valve unspecified: I38

## 2014-04-26 HISTORY — DX: Dependence on other enabling machines and devices: Z99.89

## 2014-04-26 LAB — CBC WITH DIFFERENTIAL/PLATELET
BASOS PCT: 1 % (ref 0–1)
Basophils Absolute: 0.1 10*3/uL (ref 0.0–0.1)
EOS ABS: 0.1 10*3/uL (ref 0.0–0.7)
Eosinophils Relative: 2 % (ref 0–5)
HCT: 38.7 % (ref 36.0–46.0)
Hemoglobin: 12.9 g/dL (ref 12.0–15.0)
Lymphocytes Relative: 21 % (ref 12–46)
Lymphs Abs: 1.3 10*3/uL (ref 0.7–4.0)
MCH: 29.5 pg (ref 26.0–34.0)
MCHC: 33.3 g/dL (ref 30.0–36.0)
MCV: 88.6 fL (ref 78.0–100.0)
Monocytes Absolute: 0.4 10*3/uL (ref 0.1–1.0)
Monocytes Relative: 6 % (ref 3–12)
NEUTROS PCT: 70 % (ref 43–77)
Neutro Abs: 4.2 10*3/uL (ref 1.7–7.7)
PLATELETS: 263 10*3/uL (ref 150–400)
RBC: 4.37 MIL/uL (ref 3.87–5.11)
RDW: 13.6 % (ref 11.5–15.5)
WBC: 6.1 10*3/uL (ref 4.0–10.5)

## 2014-04-26 LAB — COMPREHENSIVE METABOLIC PANEL
ALBUMIN: 3.7 g/dL (ref 3.5–5.2)
ALT: 26 U/L (ref 0–35)
AST: 26 U/L (ref 0–37)
Alkaline Phosphatase: 127 U/L — ABNORMAL HIGH (ref 39–117)
BUN: 16 mg/dL (ref 6–23)
CHLORIDE: 102 meq/L (ref 96–112)
CO2: 27 mEq/L (ref 19–32)
Calcium: 9.8 mg/dL (ref 8.4–10.5)
Creatinine, Ser: 1.35 mg/dL — ABNORMAL HIGH (ref 0.50–1.10)
GFR calc Af Amer: 46 mL/min — ABNORMAL LOW (ref >90)
GFR calc non Af Amer: 40 mL/min — ABNORMAL LOW (ref >90)
Glucose, Bld: 112 mg/dL — ABNORMAL HIGH (ref 70–99)
POTASSIUM: 4.4 meq/L (ref 3.7–5.3)
SODIUM: 140 meq/L (ref 137–147)
TOTAL PROTEIN: 7 g/dL (ref 6.0–8.3)
Total Bilirubin: 0.3 mg/dL (ref 0.3–1.2)

## 2014-04-26 LAB — TSH: TSH: 1.11 u[IU]/mL (ref 0.350–4.500)

## 2014-04-26 LAB — TROPONIN I
Troponin I: <0.3 ng/mL (ref <0.30)
Troponin I: <0.3 ng/mL (ref <0.30)

## 2014-04-26 LAB — PRO B NATRIURETIC PEPTIDE: Pro B Natriuretic peptide (BNP): 82.3 pg/mL (ref 0–125)

## 2014-04-26 MED ORDER — MORPHINE SULFATE 4 MG/ML IJ SOLN
4.0000 mg | Freq: Once | INTRAMUSCULAR | Status: AC
  Administered 2014-04-26: 4 mg via INTRAVENOUS
  Filled 2014-04-26: qty 1

## 2014-04-26 MED ORDER — ACETAMINOPHEN 325 MG PO TABS
650.0000 mg | ORAL_TABLET | Freq: Once | ORAL | Status: AC
  Administered 2014-04-26: 650 mg via ORAL
  Filled 2014-04-26: qty 2

## 2014-04-26 MED ORDER — MORPHINE SULFATE 2 MG/ML IJ SOLN
2.0000 mg | INTRAMUSCULAR | Status: DC | PRN
  Administered 2014-04-26 (×2): 2 mg via INTRAVENOUS
  Filled 2014-04-26 (×2): qty 1

## 2014-04-26 MED ORDER — LEVOTHYROXINE SODIUM 75 MCG PO TABS
75.0000 ug | ORAL_TABLET | Freq: Every day | ORAL | Status: DC
  Administered 2014-04-27 – 2014-04-28 (×2): 75 ug via ORAL
  Filled 2014-04-26 (×2): qty 1

## 2014-04-26 MED ORDER — PANTOPRAZOLE SODIUM 40 MG PO TBEC
40.0000 mg | DELAYED_RELEASE_TABLET | Freq: Two times a day (BID) | ORAL | Status: DC | PRN
  Administered 2014-04-27: 40 mg via ORAL
  Filled 2014-04-26: qty 1

## 2014-04-26 MED ORDER — ESCITALOPRAM OXALATE 10 MG PO TABS
10.0000 mg | ORAL_TABLET | Freq: Every day | ORAL | Status: DC
  Administered 2014-04-26 – 2014-04-27 (×2): 10 mg via ORAL
  Filled 2014-04-26 (×2): qty 1

## 2014-04-26 MED ORDER — NITROGLYCERIN 0.4 MG SL SUBL
0.4000 mg | SUBLINGUAL_TABLET | SUBLINGUAL | Status: DC | PRN
  Administered 2014-04-26: 0.4 mg via SUBLINGUAL

## 2014-04-26 MED ORDER — ASPIRIN EC 325 MG PO TBEC
325.0000 mg | DELAYED_RELEASE_TABLET | Freq: Every day | ORAL | Status: DC
  Administered 2014-04-27: 325 mg via ORAL
  Filled 2014-04-26: qty 1

## 2014-04-26 MED ORDER — GI COCKTAIL ~~LOC~~: 30.0000 mL | Freq: Four times a day (QID) | ORAL | Status: DC | PRN

## 2014-04-26 MED ORDER — SIMVASTATIN 20 MG PO TABS
20.0000 mg | ORAL_TABLET | Freq: Every evening | ORAL | Status: DC
  Administered 2014-04-26 – 2014-04-27 (×2): 20 mg via ORAL
  Filled 2014-04-26 (×2): qty 1

## 2014-04-26 MED ORDER — ENOXAPARIN SODIUM 40 MG/0.4ML ~~LOC~~ SOLN
40.0000 mg | SUBCUTANEOUS | Status: DC
  Administered 2014-04-26 – 2014-04-27 (×2): 40 mg via SUBCUTANEOUS
  Filled 2014-04-26 (×2): qty 0.4

## 2014-04-26 MED ORDER — TRAMADOL HCL 50 MG PO TABS
50.0000 mg | ORAL_TABLET | Freq: Once | ORAL | Status: AC
  Administered 2014-04-26: 50 mg via ORAL
  Filled 2014-04-26: qty 1

## 2014-04-26 MED ORDER — ASPIRIN 81 MG PO CHEW
324.0000 mg | CHEWABLE_TABLET | Freq: Once | ORAL | Status: AC
  Administered 2014-04-26: 324 mg via ORAL
  Filled 2014-04-26: qty 4

## 2014-04-26 MED ORDER — ONDANSETRON HCL 4 MG/2ML IJ SOLN
4.0000 mg | Freq: Once | INTRAMUSCULAR | Status: AC
Start: 2014-04-26 — End: 2014-04-26
  Administered 2014-04-26: 4 mg via INTRAVENOUS
  Filled 2014-04-26: qty 2

## 2014-04-26 NOTE — ED Notes (Signed)
Pt with off and on chest pain for 2 weeks. States woke up with headache yesterday morning and has not experienced any relief since taking Excedrin.

## 2014-04-26 NOTE — Progress Notes (Signed)
Discussed with patient the use of a CPAP while she sleeps,but she declined since she is currently still having headaches. She said the straps increased the intensity of her headaches. So placed patient on [place] for sleep since during her sleep study she was told she dropped into the 70's. Will continue to monitor patient through the night or any changes; CPAP on stand-by.

## 2014-04-26 NOTE — ED Notes (Signed)
Intermittent cp x 2 weeks. At present has pressure and pain radiating down left arm. Also c/o headache and nausea.

## 2014-04-26 NOTE — H&P (Signed)
History and Physical  [name] VQQ:[id number] DOB: [birth year] DOA: 04/26/2014  Referring physician: Dr. Betsey Holiday in ED PCP: Lanette Hampshire, MD   Chief Complaint: Headache and chest pain  HPI:  68 year old woman with no history of cardiac disease presented to the emergency department with complaints of left-sided chest pain and frontal headache. Initial evaluation was reassured and she was referred for further evaluation of chest pain.  Patient reports a two-week history of intermittent left-sided pressure-like chest pain that occurs without apparent provoking factors, unassociated with exertion and spontaneously resolves over the course of 30 minutes or so. Associated with some shortness of breath and some pain in her left arm. Nausea but no vomiting. No diaphoresis. She has no history of cardiac disease except for some kind of valvular problem which she reports recommendations were for observation only. This morning she noticed the chest pain again lasting approximately 30 minutes that did respond to nitroglycerin. Currently she is not short of breath and pain seems to have resolved.  She reports a headache for approximately 48 hours now, described as frontal in nature, severe with some blurry vision. She occasionally has headaches but has not had one this painful.  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 Initially she suffers from insomnia and rarely sleeps more than 2 hours at night in her fluid intake is poor.  In the emergency department vitals stable, no hypoxia. Afebrile. Complete metabolic panel notable for creatinine of 1.35. Troponin and BNP unremarkable. CBC normal. CT of the head unremarkable. Chest x-ray no acute disease. EKG normal sinus rhythm with no acute changes.  Review of Systems:  Negative for fever, sore throat, rash, new muscle aches, dysuria, bleeding, vomiting,  abdominal pain.  Positive for some nausea. Positive for several month history of diarrhea following meals.  Past Medical History  Diagnosis Date  . Hypothyroidism   . GERD (gastroesophageal reflux disease)   . Hypercholesterolemia     can't take meds, affects LFTs  . S/P colonoscopy Oct 2011    Dr. Arnoldo Morale: mild diverticulosis in sigmoid, otherwise normal  . Esophageal stricture   . Gastritis   . Diverticulosis   . Heart valve problem   . OSA on CPAP     Past Surgical History  Procedure Laterality Date  . Total abdominal hysterectomy    . Cholecystectomy    . Tubal ligation    . Thyroid surgery  2008    left  thyroid removed  . Appendectomy    . Esophagogastroduodenoscopy  09/28/11    mild gastritis/stricture in the distal esophagus/small gastric polyps benign    Social History:  reports that she has never smoked. She does not have any smokeless tobacco history on file. She reports that she does not drink alcohol or use illicit drugs. No family history of heart disease.  No Known Allergies  Family History  Problem Relation Age of Onset  . Colon cancer Mother     in remission, diagnosed age 61  . Anesthesia problems Neg Hx   . Hypotension Neg Hx   . Malignant hyperthermia Neg Hx   . Pseudochol deficiency Neg Hx      Prior to Admission medications   Medication Sig Start Date End Date Taking? Authorizing Provider  Cholecalciferol (VITAMIN D) 2000 UNITS CAPS Take 1 capsule by mouth daily.   Yes Historical Provider, MD  escitalopram (LEXAPRO) 10 MG tablet Take  10 mg by mouth daily.   Yes Historical Provider, MD  levothyroxine (SYNTHROID, LEVOTHROID) 75 MCG tablet Take 75 mcg by mouth daily before breakfast.   Yes Historical Provider, MD  pantoprazole (PROTONIX) 40 MG tablet Take 40 mg by mouth 2 (two) times daily as needed (acid reflux).    Yes Historical Provider, MD  simvastatin (ZOCOR) 20 MG tablet Take 20 mg by mouth every evening.  09/28/11  Yes Historical Provider,  MD   Physical Exam: Filed Vitals:   04/26/14 1430 04/26/14 1445 04/26/14 1500 04/26/14 1515  BP: 144/78  143/76   Pulse: 51 54 61 58  Temp:      TempSrc:      Resp: 23 18 15 13   SpO2: 94% 97% 93% 98%    General: Examined in emergency department. Appears calm and comfortable Eyes: PERRL, normal lids, irises  ENT: grossly normal hearing, lips & tongue Neck: no LAD, masses or thyromegaly Cardiovascular: RRR, no m/r/g. No LE edema. Respiratory: CTA bilaterally, no w/r/r. Normal respiratory effort. Abdomen: soft, ntnd. Obese. Skin: no rash or induration seen Musculoskeletal: grossly normal tone BUE/BLE Psychiatric: grossly normal mood and affect, speech fluent and appropriate Neurologic: Cranial nerves 2-12 intact. No upper extremity dysdiadochokinesis. No pronator drift. Grossly normal tone and strength all extremities.  Wt Readings from Last 3 Encounters:  03/06/13 77.111 kg (170 lb)  02/12/13 77.111 kg (170 lb)  08/01/12 89.359 kg (197 lb)    Labs on Admission:  Basic Metabolic Panel:  Recent Labs Lab 04/26/14 1220  NA 140  K 4.4  CL 102  CO2 27  GLUCOSE 112*  BUN 16  CREATININE 1.35*  CALCIUM 9.8    Liver Function Tests:  Recent Labs Lab 04/26/14 1220  AST 26  ALT 26  ALKPHOS 127*  BILITOT 0.3  PROT 7.0  ALBUMIN 3.7    CBC:  Recent Labs Lab 04/26/14 1220  WBC 6.1  NEUTROABS 4.2  HGB 12.9  HCT 38.7  MCV 88.6  PLT 263    Cardiac Enzymes:  Recent Labs Lab 04/26/14 1220  TROPONINI <0.30     Recent Labs  04/26/14 1220  PROBNP 82.3     Radiological Exams on Admission: Dg Chest 2 View  04/26/2014   CLINICAL DATA:  Chest pain.  Dizziness.  EXAM: CHEST  2 VIEW  COMPARISON:  11/04/2013  FINDINGS: Cardiac silhouette is mildly enlarged. The aorta is uncoiled. No mediastinal or hilar masses. No evidence of adenopathy.  Clear lungs.  No pleural effusion.  No pneumothorax.  The bony thorax is demineralized but intact.  IMPRESSION: No acute  cardiopulmonary disease   Electronically Signed   By: Lajean Manes M.D.   On: 04/26/2014 13:24   Ct Head Wo Contrast  04/26/2014   CLINICAL DATA:  Headache for 2 days.  EXAM: CT HEAD WITHOUT CONTRAST  TECHNIQUE: Contiguous axial images were obtained from the base of the skull through the vertex without contrast.  COMPARISON:  None  FINDINGS: No evidence for acute infarction, hemorrhage, mass lesion, hydrocephalus, or extra-axial fluid. Slight atrophy. Mild chronic microvascular ischemic change suspected. Calvarium is intact. Mild vascular calcification. There is no sinus or mastoid disease. Extracranial soft tissues unremarkable.  IMPRESSION: Mild age-related changes.  No acute intracranial findings.   Electronically Signed   By: Rolla Flatten M.D.   On: 04/26/2014 13:07    Principal Problem:   Chest pain Active Problems:   Hypothyroidism   Headache   GERD (gastroesophageal reflux disease)   OSA  on CPAP   Assessment/Plan 1. Chest pain with some typical and atypical features, somewhat concerning, plan admission for further evaluation. No signs or symptoms to suggest ACS at this point. Possibly related to psychosocial stressors. 2. Frontal headache. No focal neurologic deficits. Suspect related to stress, insomnia, poor oral intake. CT head negative. Follow clinically. 3. Hypothyroidism. 4. History of GERD and esophageal stricture. She reports her GERD is well controlled and she has very little trouble swallowing only very occasionally. 5. Obstructive sleep apnea maintained on CPAP at night.   Admit to telemetry. Serial troponins. Telemetry. Monitor for recurrent pain. Consider outpatient cardiology evaluation if no further pain.  Continue PPI. Check TSH.  CPAP each night.  Discussed diagnostic impression, current testing and treatment recommendations with patient at bedside.  Code Status: full code  DVT prophylaxis:Lovenox Family Communication: friend Disposition Plan/Anticipated LOS:  obs, 24 hours  Time spent: 74 minutes  Murray Hodgkins, MD  [facility] Pager [phone] 04/26/2014, 3:40 PM

## 2014-04-26 NOTE — ED Provider Notes (Signed)
CSN: [id number]     Arrival date & time 04/26/14  1107 History  This chart was scribed for Orpah Greek, MD by Roxan Diesel, ED scribe.  This patient was seen in room APA09/APA09 and the patient's care was started at 12:00 PM.   Chief Complaint  Patient presents with  . Chest Pain  . Headache    The history is provided by the patient. No language interpreter was used.    HPI Comments: [name] is a 68 y.o. female who presents to the Emergency Department complaining of 2 weeks of intermittent left-sided chest pain.  Pt describes pain as a pressure "like something's sitting on it" radiating down her left arm.  Pain worsens with activity and improves with rest.  Pt also states she has been fatigued recently.  She admits to h/o "leaky valve" but denies personal or family h/o MI or other heart disease.  She denies h/o HTN.    Pt also complains of a headache that began yesterday morning.  She localizes pain behind both eyes and in her forehead.  She describes pain as severe, constant and sharp.  She attempted to treat pain with Excedrin, without relief.  She also reports nausea and blurred vision.  She denies focal weakness, numbness or tingling.  She denies h/o migraines.   Past Medical History  Diagnosis Date  . Hypothyroidism   . GERD (gastroesophageal reflux disease)   . Hypercholesterolemia     can't take meds, affects LFTs  . S/P colonoscopy Oct 2011    Dr. Arnoldo Morale: mild diverticulosis in sigmoid, otherwise normal  . Esophageal stricture   . Gastritis   . Diverticulosis   . Heart valve problem     Past Surgical History  Procedure Laterality Date  . Total abdominal hysterectomy    . Cholecystectomy    . Tubal ligation    . Thyroid surgery  2008    left  thyroid removed  . Appendectomy    . Esophagogastroduodenoscopy  09/28/11    mild gastritis/stricture in the distal esophagus/small gastric polyps benign    Family History  Problem Relation Age of Onset  .  Colon cancer Mother     in remission, diagnosed age 103  . Anesthesia problems Neg Hx   . Hypotension Neg Hx   . Malignant hyperthermia Neg Hx   . Pseudochol deficiency Neg Hx     History  Substance Use Topics  . Smoking status: Never Smoker   . Smokeless tobacco: Not on file  . Alcohol Use: No    OB History   Grav Para Term Preterm Abortions TAB SAB Ect Mult Living                   Review of Systems  Constitutional: Positive for fatigue.  Eyes: Positive for visual disturbance.  Cardiovascular: Positive for chest pain.  Gastrointestinal: Positive for nausea.  Neurological: Positive for headaches. Negative for weakness and numbness.  All other systems reviewed and are negative.     Allergies  Review of patient's allergies indicates no known allergies.  Home Medications   Prior to Admission medications   Medication Sig Start Date End Date Taking? Authorizing Provider  pantoprazole (PROTONIX) 40 MG tablet Take 40 mg by mouth 2 (two) times daily.     Historical Provider, MD  simvastatin (ZOCOR) 20 MG tablet Take 20 mg by mouth daily.   09/28/11   Historical Provider, MD   BP 177/87  Pulse 58  Temp(Src) 98  F (36.7 C) (Oral)  Resp 30  SpO2 99%  Physical Exam  Nursing note and vitals reviewed. Constitutional: She is oriented to person, place, and time. She appears well-developed and well-nourished. No distress.  HENT:  Head: Normocephalic and atraumatic.  Right Ear: Hearing normal.  Left Ear: Hearing normal.  Nose: Nose normal.  Mouth/Throat: Oropharynx is clear and moist and mucous membranes are normal.  Eyes: Conjunctivae and EOM are normal. Pupils are equal, round, and reactive to light.  Neck: Normal range of motion. Neck supple.  Cardiovascular: Normal rate, regular rhythm, S1 normal, S2 normal and normal heart sounds.  Exam reveals no gallop and no friction rub.   No murmur heard. Pulmonary/Chest: Effort normal and breath sounds normal. No respiratory  distress. She has no wheezes. She has no rales. She exhibits no tenderness.  Abdominal: Soft. Normal appearance and bowel sounds are normal. There is no hepatosplenomegaly. There is no tenderness. There is no rebound, no guarding, no tenderness at McBurney's point and negative Murphy's sign. No hernia.  Musculoskeletal: Normal range of motion.  Neurological: She is alert and oriented to person, place, and time. She has normal strength. No cranial nerve deficit or sensory deficit. Coordination normal. GCS eye subscore is 4. GCS verbal subscore is 5. GCS motor subscore is 6.  Skin: Skin is warm, dry and intact. No rash noted. No cyanosis.  Psychiatric: She has a normal mood and affect. Her speech is normal and behavior is normal. Thought content normal.    ED Course  Procedures (including critical care time)  DIAGNOSTIC STUDIES: Oxygen Saturation is 99% on room air, normal by my interpretation.    COORDINATION OF CARE: 12:05 PM-Discussed treatment plan which includes aspirin, nitroglycerin, CXR, head CT, EKG and labs with pt at bedside and pt agreed to plan.     Labs Review Labs Reviewed  COMPREHENSIVE METABOLIC PANEL - Abnormal; Notable for the following:    Glucose, Bld 112 (*)    Creatinine, Ser 1.35 (*)    Alkaline Phosphatase 127 (*)    GFR calc non Af Amer 40 (*)    GFR calc Af Amer 46 (*)    All other components within normal limits  CBC WITH DIFFERENTIAL  PRO B NATRIURETIC PEPTIDE  TROPONIN I    Imaging Review Dg Chest 2 View  04/26/2014   CLINICAL DATA:  Chest pain.  Dizziness.  EXAM: CHEST  2 VIEW  COMPARISON:  11/04/2013  FINDINGS: Cardiac silhouette is mildly enlarged. The aorta is uncoiled. No mediastinal or hilar masses. No evidence of adenopathy.  Clear lungs.  No pleural effusion.  No pneumothorax.  The bony thorax is demineralized but intact.  IMPRESSION: No acute cardiopulmonary disease   Electronically Signed   By: Lajean Manes M.D.   On: 04/26/2014 13:24   Ct  Head Wo Contrast  04/26/2014   CLINICAL DATA:  Headache for 2 days.  EXAM: CT HEAD WITHOUT CONTRAST  TECHNIQUE: Contiguous axial images were obtained from the base of the skull through the vertex without contrast.  COMPARISON:  None  FINDINGS: No evidence for acute infarction, hemorrhage, mass lesion, hydrocephalus, or extra-axial fluid. Slight atrophy. Mild chronic microvascular ischemic change suspected. Calvarium is intact. Mild vascular calcification. There is no sinus or mastoid disease. Extracranial soft tissues unremarkable.  IMPRESSION: Mild age-related changes.  No acute intracranial findings.   Electronically Signed   By: Rolla Flatten M.D.   On: 04/26/2014 13:07     EKG Interpretation   Date/Time:  Saturday Apr 26 2014 11:38:40 EDT Ventricular Rate:  65 PR Interval:  148 QRS Duration: 91 QT Interval:  441 QTC Calculation: 459 R Axis:   53 Text Interpretation:  Sinus rhythm Atrial premature complex Low voltage,  precordial leads Confirmed by Kurk Corniel  MD, Alesana Magistro ([id number]) on 04/26/2014  2:47:23 PM      MDM   Final diagnoses:  Chest pain; Headache    Patient presents to the ER for complaints of chest pain and headache. Patient has been experiencing intermittent chest pain for 2 weeks. Pain is exertional in nature. When it is present it radiates to the left arm. It improves after she sits down and rests. She does not have a cardiac history, but this is concerning for possible cardiac etiology.  He has, however, had some atypical features. Patient is currently experiencing pain at rest that did not respond to nitroglycerin. We will review records reveal she does have fairly significant GERD and gastritis history. Symptoms could be GI in nature, but will require further evaluation.  This, headache. She has a bifrontal headache with mild blurred vision. Otherwise her neurologic examination is unremarkable. Head CT was negative.   I personally performed the services described in  this documentation, which was scribed in my presence. The recorded information has been reviewed and is accurate.     Orpah Greek, MD 04/26/14 1450

## 2014-04-27 DIAGNOSIS — R0789 Other chest pain: Secondary | ICD-10-CM | POA: Diagnosis not present

## 2014-04-27 DIAGNOSIS — R079 Chest pain, unspecified: Secondary | ICD-10-CM | POA: Diagnosis not present

## 2014-04-27 DIAGNOSIS — J320 Chronic maxillary sinusitis: Secondary | ICD-10-CM | POA: Diagnosis not present

## 2014-04-27 DIAGNOSIS — J321 Chronic frontal sinusitis: Secondary | ICD-10-CM | POA: Diagnosis not present

## 2014-04-27 DIAGNOSIS — I517 Cardiomegaly: Secondary | ICD-10-CM

## 2014-04-27 DIAGNOSIS — G4733 Obstructive sleep apnea (adult) (pediatric): Secondary | ICD-10-CM | POA: Diagnosis not present

## 2014-04-27 DIAGNOSIS — K219 Gastro-esophageal reflux disease without esophagitis: Secondary | ICD-10-CM | POA: Diagnosis not present

## 2014-04-27 DIAGNOSIS — R51 Headache: Secondary | ICD-10-CM | POA: Diagnosis not present

## 2014-04-27 LAB — LIPID PANEL
CHOL/HDL RATIO: 4.3 ratio
Cholesterol: 215 mg/dL — ABNORMAL HIGH (ref 0–200)
HDL: 50 mg/dL (ref >39)
LDL Cholesterol: 126 mg/dL — ABNORMAL HIGH (ref 0–99)
Triglycerides: 197 mg/dL — ABNORMAL HIGH (ref <150)
VLDL: 39 mg/dL (ref 0–40)

## 2014-04-27 LAB — TROPONIN I: Troponin I: <0.3 ng/mL (ref <0.30)

## 2014-04-27 MED ORDER — HYDROCODONE-ACETAMINOPHEN 5-325 MG PO TABS
1.0000 | ORAL_TABLET | ORAL | Status: DC | PRN
  Administered 2014-04-27 – 2014-04-28 (×4): 1 via ORAL
  Filled 2014-04-27 (×4): qty 1

## 2014-04-27 MED ORDER — SUMATRIPTAN SUCCINATE 25 MG PO TABS
ORAL_TABLET | ORAL | Status: AC
  Filled 2014-04-27: qty 1

## 2014-04-27 MED ORDER — SUMATRIPTAN SUCCINATE 25 MG PO TABS
25.0000 mg | ORAL_TABLET | ORAL | Status: DC | PRN
  Administered 2014-04-27: 25 mg via ORAL
  Filled 2014-04-27: qty 1

## 2014-04-27 NOTE — Progress Notes (Signed)
Subjective: The patient was admitted with chest pain. This was felt to be atypical and possibly related to stress. She continues to have headache she does have history of GERD which is well controlled and obstructive sleep apnea.  Objective: Vital signs in last 24 hours: Temp:  [97.3 F (36.3 C)-98.1 F (36.7 C)] 97.3 F (36.3 C) (05/03 0533) Pulse Rate:  [47-61] 51 (05/03 0533) Resp:  [11-30] 18 (05/03 0533) BP: (115-177)/(65-105) 135/75 mmHg (05/03 0533) SpO2:  [92 %-99 %] 94 % (05/03 0533) Weight:  [83.961 kg (185 lb 1.6 oz)] 83.961 kg (185 lb 1.6 oz) (05/02 1609) Weight change:  Last BM Date: 04/25/14  Intake/Output from previous day: 05/02 0701 - 05/03 0700 In: -  Out: 300 [Urine:300] Intake/Output this shift:    Physical Exam: Patient is alert and oriented  HEENT negative  Neck supple no JVD or thyroid abnormalities  Heart regular rhythm no murmurs  Lungs clear to P&A  Abdomen no palpable organs or masses   Recent Labs  04/26/14 1220  WBC 6.1  HGB 12.9  HCT 38.7  PLT 263   BMET  Recent Labs  04/26/14 1220  NA 140  K 4.4  CL 102  CO2 27  GLUCOSE 112*  BUN 16  CREATININE 1.35*  CALCIUM 9.8    Studies/Results: Dg Chest 2 View  04/26/2014   CLINICAL DATA:  Chest pain.  Dizziness.  EXAM: CHEST  2 VIEW  COMPARISON:  11/04/2013  FINDINGS: Cardiac silhouette is mildly enlarged. The aorta is uncoiled. No mediastinal or hilar masses. No evidence of adenopathy.  Clear lungs.  No pleural effusion.  No pneumothorax.  The bony thorax is demineralized but intact.  IMPRESSION: No acute cardiopulmonary disease   Electronically Signed   By: Lajean Manes M.D.   On: 04/26/2014 13:24   Ct Head Wo Contrast  04/26/2014   CLINICAL DATA:  Headache for 2 days.  EXAM: CT HEAD WITHOUT CONTRAST  TECHNIQUE: Contiguous axial images were obtained from the base of the skull through the vertex without contrast.  COMPARISON:  None  FINDINGS: No evidence for acute infarction,  hemorrhage, mass lesion, hydrocephalus, or extra-axial fluid. Slight atrophy. Mild chronic microvascular ischemic change suspected. Calvarium is intact. Mild vascular calcification. There is no sinus or mastoid disease. Extracranial soft tissues unremarkable.  IMPRESSION: Mild age-related changes.  No acute intracranial findings.   Electronically Signed   By: Rolla Flatten M.D.   On: 04/26/2014 13:07    Medications:  . aspirin EC  325 mg Oral Daily  . enoxaparin (LOVENOX) injection  40 mg Subcutaneous Q24H  . escitalopram  10 mg Oral Daily  . levothyroxine  75 mcg Oral QAC breakfast  . simvastatin  20 mg Oral QPM        Assessment/Plan: 1 atypical chest pain, no sign of ACS 2. Frontal headache  2. Frontal headache  3. Hypothyroidism  4 GERD and esophageal stricture  5 obstructive sleep apnea plan to continue her medications as noted above with symptomatic treatment.   LOS: 1 day   Advith Martine G Lisseth Brazeau 04/27/2014, 7:22 AM

## 2014-04-28 DIAGNOSIS — R079 Chest pain, unspecified: Secondary | ICD-10-CM | POA: Diagnosis not present

## 2014-04-28 DIAGNOSIS — R51 Headache: Secondary | ICD-10-CM | POA: Diagnosis not present

## 2014-04-28 DIAGNOSIS — G4733 Obstructive sleep apnea (adult) (pediatric): Secondary | ICD-10-CM | POA: Diagnosis not present

## 2014-04-28 DIAGNOSIS — K219 Gastro-esophageal reflux disease without esophagitis: Secondary | ICD-10-CM | POA: Diagnosis not present

## 2014-04-28 MED ORDER — HYDROCODONE-ACETAMINOPHEN 5-325 MG PO TABS: 1.0000 | ORAL_TABLET | ORAL | Status: DC | PRN

## 2014-04-28 NOTE — Discharge Summary (Signed)
Physician Discharge Summary  [name] GYI:[id number] DOB: 18-Jan-1946 DOA: 04/26/2014  PCP: Lanette Hampshire, MD  Admit date: 04/26/2014 Discharge date: 04/28/2014    discharge diagnoses 1. Atypical chest pain no sign of ACS  2. Frontal headache frontal and maxillary sinusitis  3. GERD, esophageal stricture  4. Obstructive sleep apnea  Discharge Condition: Condition good Disposition: Home Diet recommendation: Regular diet  Filed Weights   04/26/14 1609  Weight: 83.961 kg (185 lb 1.6 oz)    History of present illness:  The patient came to the emergency room with a complaint of left-sided chest pain and frontal headache. She had had left sided intermittent pain which would reside in the course of 30 minutes or so associated with some shortness of breath. Also patient been having headache which is described as moderate to severe in frontal area. Chest x-ray done on admission did not show abnormality, EKG on admission showed normal sinus rhythm no acute changes troponin was normal CT of head no acute intracranial findings.  Hospital Course:  The patient was admitted to telemetry bed and monitored. She does have sleep apnea and uses CPAP at home. It was felt that possibly symptoms related to stress which he been experiencing at home. She did not have evidence of acute coronary syndrome her chest symptoms improved. He continued have frontal headache which was felt to be due to sinusitis. She continued on the following medications. Hospital stay. She was was given antibiotic at the time of discharge Discharge Instructions the patient was instructed to continue the following medications and return to primary care office in 2-3 days or she will be provided with antibiotic to treat sinus infection.   Future Appointments Provider Department Dept Phone   06/06/2014 1:30 PM Lafayette Dragon, MD [facility] [phone]       Medication List         escitalopram 10 MG tablet   Commonly known as:  LEXAPRO  Take 10 mg by mouth daily.     HYDROcodone-acetaminophen 5-325 MG per tablet  Commonly known as:  NORCO/VICODIN  Take 1 tablet by mouth every 4 (four) hours as needed for moderate pain (headache).     levothyroxine 75 MCG tablet  Commonly known as:  SYNTHROID, LEVOTHROID  Take 75 mcg by mouth daily before breakfast.     pantoprazole 40 MG tablet  Commonly known as:  PROTONIX  Take 40 mg by mouth 2 (two) times daily as needed (acid reflux).     simvastatin 20 MG tablet  Commonly known as:  ZOCOR  Take 20 mg by mouth every evening.     Vitamin D 2000 UNITS Caps  Take 1 capsule by mouth daily.       No Known Allergies  The results of significant diagnostics from this hospitalization (including imaging, microbiology, ancillary and laboratory) are listed below for reference.    Significant Diagnostic Studies: Dg Chest 2 View  04/26/2014   CLINICAL DATA:  Chest pain.  Dizziness.  EXAM: CHEST  2 VIEW  COMPARISON:  11/04/2013  FINDINGS: Cardiac silhouette is mildly enlarged. The aorta is uncoiled. No mediastinal or hilar masses. No evidence of adenopathy.  Clear lungs.  No pleural effusion.  No pneumothorax.  The bony thorax is demineralized but intact.  IMPRESSION: No acute cardiopulmonary disease   Electronically Signed   By: Lajean Manes M.D.   On: 04/26/2014 13:24   Ct Head Wo Contrast  04/26/2014   CLINICAL DATA:  Headache for 2 days.  EXAM: CT HEAD WITHOUT CONTRAST  TECHNIQUE: Contiguous axial images were obtained from the base of the skull through the vertex without contrast.  COMPARISON:  None  FINDINGS: No evidence for acute infarction, hemorrhage, mass lesion, hydrocephalus, or extra-axial fluid. Slight atrophy. Mild chronic microvascular ischemic change suspected. Calvarium is intact. Mild vascular calcification. There is no sinus or mastoid disease. Extracranial soft tissues unremarkable.  IMPRESSION: Mild age-related changes.  No acute intracranial  findings.   Electronically Signed   By: Rolla Flatten M.D.   On: 04/26/2014 13:07    Microbiology: No results found for this or any previous visit (from the past 240 hour(s)).   Labs: Basic Metabolic Panel:  Recent Labs Lab 04/26/14 1220  NA 140  K 4.4  CL 102  CO2 27  GLUCOSE 112*  BUN 16  CREATININE 1.35*  CALCIUM 9.8   Liver Function Tests:  Recent Labs Lab 04/26/14 1220  AST 26  ALT 26  ALKPHOS 127*  BILITOT 0.3  PROT 7.0  ALBUMIN 3.7   No results found for this basename: LIPASE, AMYLASE,  in the last 168 hours No results found for this basename: AMMONIA,  in the last 168 hours CBC:  Recent Labs Lab 04/26/14 1220  WBC 6.1  NEUTROABS 4.2  HGB 12.9  HCT 38.7  MCV 88.6  PLT 263   Cardiac Enzymes:  Recent Labs Lab 04/26/14 1220 04/26/14 1810 04/27/14 0013 04/27/14 0616  TROPONINI <0.30 <0.30 <0.30 <0.30   BNP: BNP (last 3 results)  Recent Labs  04/26/14 1220  PROBNP 82.3   CBG: No results found for this basename: GLUCAP,  in the last 168 hours  Principal Problem:   Chest pain Active Problems:   Hypothyroidism   Headache   GERD (gastroesophageal reflux disease)   OSA on CPAP   Time coordinating discharge: 30 minutes Signed:  Marjean Donna, MD 04/28/2014, 6:29 AM

## 2014-04-28 NOTE — Progress Notes (Signed)
Patient discharged home with family.  IV removed - WNL.  Instructed on new medications and emphasized importance of taking complete dose of Abx to prevent further infection.  Instructed to seek medical attention with any return of CP or MI s/s.  Verbalizes understanding.  No questions at this time.  Stable to DC - left floor via WC with help of RN

## 2014-06-06 ENCOUNTER — Ambulatory Visit: Payer: Medicare Other | Admitting: Internal Medicine

## 2014-06-24 ENCOUNTER — Other Ambulatory Visit (HOSPITAL_COMMUNITY): Payer: Self-pay | Admitting: Family Medicine

## 2014-06-24 DIAGNOSIS — Z1231 Encounter for screening mammogram for malignant neoplasm of breast: Secondary | ICD-10-CM

## 2014-06-24 DIAGNOSIS — Z139 Encounter for screening, unspecified: Secondary | ICD-10-CM

## 2014-07-02 ENCOUNTER — Ambulatory Visit (HOSPITAL_COMMUNITY)
Admission: RE | Admit: 2014-07-02 | Discharge: 2014-07-02 | Disposition: A | Discharge status: Home / Self Care | Payer: Medicare Other | Source: Ambulatory Visit | Attending: Family Medicine | Admitting: Family Medicine

## 2014-07-02 DIAGNOSIS — Z1231 Encounter for screening mammogram for malignant neoplasm of breast: Secondary | ICD-10-CM | POA: Diagnosis not present

## 2014-07-23 ENCOUNTER — Encounter: Payer: Self-pay | Admitting: Internal Medicine

## 2014-07-25 ENCOUNTER — Encounter: Payer: Self-pay | Admitting: *Deleted

## 2014-07-29 DIAGNOSIS — H2510 Age-related nuclear cataract, unspecified eye: Secondary | ICD-10-CM | POA: Diagnosis not present

## 2014-08-13 DIAGNOSIS — H2510 Age-related nuclear cataract, unspecified eye: Secondary | ICD-10-CM | POA: Diagnosis not present

## 2014-08-20 DIAGNOSIS — H2510 Age-related nuclear cataract, unspecified eye: Secondary | ICD-10-CM | POA: Diagnosis not present

## 2014-09-03 DIAGNOSIS — Z79899 Other long term (current) drug therapy: Secondary | ICD-10-CM | POA: Diagnosis not present

## 2014-09-03 DIAGNOSIS — E785 Hyperlipidemia, unspecified: Secondary | ICD-10-CM | POA: Diagnosis not present

## 2014-09-03 DIAGNOSIS — D649 Anemia, unspecified: Secondary | ICD-10-CM | POA: Diagnosis not present

## 2014-09-03 DIAGNOSIS — E559 Vitamin D deficiency, unspecified: Secondary | ICD-10-CM | POA: Diagnosis not present

## 2014-09-03 DIAGNOSIS — E039 Hypothyroidism, unspecified: Secondary | ICD-10-CM | POA: Diagnosis not present

## 2014-09-03 DIAGNOSIS — Z439 Encounter for attention to unspecified artificial opening: Secondary | ICD-10-CM | POA: Diagnosis not present

## 2014-09-17 DIAGNOSIS — Z23 Encounter for immunization: Secondary | ICD-10-CM | POA: Diagnosis not present

## 2014-09-26 ENCOUNTER — Ambulatory Visit: Payer: Medicare Other | Admitting: Internal Medicine

## 2014-10-03 DIAGNOSIS — M1990 Unspecified osteoarthritis, unspecified site: Secondary | ICD-10-CM | POA: Diagnosis not present

## 2014-10-03 DIAGNOSIS — E039 Hypothyroidism, unspecified: Secondary | ICD-10-CM | POA: Diagnosis not present

## 2014-10-07 ENCOUNTER — Other Ambulatory Visit (HOSPITAL_COMMUNITY): Payer: Self-pay | Admitting: Family Medicine

## 2014-10-07 ENCOUNTER — Ambulatory Visit (HOSPITAL_COMMUNITY)
Admission: RE | Admit: 2014-10-07 | Discharge: 2014-10-07 | Disposition: A | Discharge status: Home / Self Care | Payer: Medicare Other | Source: Ambulatory Visit | Attending: Family Medicine | Admitting: Family Medicine

## 2014-10-07 DIAGNOSIS — M47816 Spondylosis without myelopathy or radiculopathy, lumbar region: Secondary | ICD-10-CM | POA: Diagnosis not present

## 2014-10-07 DIAGNOSIS — M545 Low back pain: Secondary | ICD-10-CM | POA: Diagnosis not present

## 2014-10-09 ENCOUNTER — Other Ambulatory Visit (HOSPITAL_COMMUNITY): Payer: Self-pay | Admitting: Family Medicine

## 2014-10-09 DIAGNOSIS — M545 Low back pain, unspecified: Secondary | ICD-10-CM

## 2014-10-13 DIAGNOSIS — G4730 Sleep apnea, unspecified: Secondary | ICD-10-CM | POA: Diagnosis not present

## 2014-10-13 DIAGNOSIS — F329 Major depressive disorder, single episode, unspecified: Secondary | ICD-10-CM | POA: Diagnosis not present

## 2014-10-15 ENCOUNTER — Ambulatory Visit (HOSPITAL_COMMUNITY)
Admission: RE | Admit: 2014-10-15 | Discharge: 2014-10-15 | Disposition: A | Discharge status: Home / Self Care | Payer: Medicare Other | Source: Ambulatory Visit | Attending: Family Medicine | Admitting: Family Medicine

## 2014-10-15 DIAGNOSIS — M545 Low back pain, unspecified: Secondary | ICD-10-CM

## 2014-10-15 DIAGNOSIS — M47896 Other spondylosis, lumbar region: Secondary | ICD-10-CM | POA: Diagnosis not present

## 2014-10-15 DIAGNOSIS — M5126 Other intervertebral disc displacement, lumbar region: Secondary | ICD-10-CM | POA: Insufficient documentation

## 2014-10-15 DIAGNOSIS — M47816 Spondylosis without myelopathy or radiculopathy, lumbar region: Secondary | ICD-10-CM | POA: Diagnosis not present

## 2014-10-21 ENCOUNTER — Other Ambulatory Visit: Payer: Self-pay | Admitting: Family Medicine

## 2014-10-21 DIAGNOSIS — M5136 Other intervertebral disc degeneration, lumbar region: Secondary | ICD-10-CM

## 2014-10-21 DIAGNOSIS — M5126 Other intervertebral disc displacement, lumbar region: Secondary | ICD-10-CM

## 2014-11-05 ENCOUNTER — Ambulatory Visit
Admission: RE | Admit: 2014-11-05 | Discharge: 2014-11-05 | Disposition: A | Discharge status: Home / Self Care | Payer: Medicare Other | Source: Ambulatory Visit | Attending: Family Medicine | Admitting: Family Medicine

## 2014-11-05 DIAGNOSIS — M545 Low back pain: Secondary | ICD-10-CM | POA: Diagnosis not present

## 2014-11-05 DIAGNOSIS — M5137 Other intervertebral disc degeneration, lumbosacral region: Secondary | ICD-10-CM | POA: Diagnosis not present

## 2014-11-05 DIAGNOSIS — M5126 Other intervertebral disc displacement, lumbar region: Secondary | ICD-10-CM

## 2014-11-05 DIAGNOSIS — M5136 Other intervertebral disc degeneration, lumbar region: Secondary | ICD-10-CM

## 2014-11-05 MED ORDER — METHYLPREDNISOLONE ACETATE 40 MG/ML INJ SUSP (RADIOLOG
120.0000 mg | Freq: Once | INTRAMUSCULAR | Status: AC
  Administered 2014-11-05: 120 mg via EPIDURAL

## 2014-11-05 MED ORDER — IOHEXOL 180 MG/ML  SOLN
1.0000 mL | Freq: Once | INTRAMUSCULAR | Status: AC | PRN
  Administered 2014-11-05: 1 mL via EPIDURAL

## 2014-11-05 NOTE — Discharge Instructions (Signed)

## 2014-12-09 DIAGNOSIS — Z961 Presence of intraocular lens: Secondary | ICD-10-CM | POA: Diagnosis not present

## 2015-01-21 DIAGNOSIS — J209 Acute bronchitis, unspecified: Secondary | ICD-10-CM | POA: Diagnosis not present

## 2015-01-21 DIAGNOSIS — J069 Acute upper respiratory infection, unspecified: Secondary | ICD-10-CM | POA: Diagnosis not present

## 2015-04-14 DIAGNOSIS — F329 Major depressive disorder, single episode, unspecified: Secondary | ICD-10-CM | POA: Diagnosis not present

## 2015-04-14 DIAGNOSIS — M797 Fibromyalgia: Secondary | ICD-10-CM | POA: Diagnosis not present

## 2015-04-14 DIAGNOSIS — G4730 Sleep apnea, unspecified: Secondary | ICD-10-CM | POA: Diagnosis not present

## 2015-04-14 DIAGNOSIS — G4700 Insomnia, unspecified: Secondary | ICD-10-CM | POA: Diagnosis not present

## 2015-05-04 DIAGNOSIS — K529 Noninfective gastroenteritis and colitis, unspecified: Secondary | ICD-10-CM | POA: Diagnosis not present

## 2015-07-01 ENCOUNTER — Other Ambulatory Visit (HOSPITAL_COMMUNITY): Payer: Self-pay | Admitting: Family Medicine

## 2015-07-01 DIAGNOSIS — Z1231 Encounter for screening mammogram for malignant neoplasm of breast: Secondary | ICD-10-CM

## 2015-07-08 DIAGNOSIS — H8309 Labyrinthitis, unspecified ear: Secondary | ICD-10-CM | POA: Diagnosis not present

## 2015-07-09 ENCOUNTER — Ambulatory Visit (HOSPITAL_COMMUNITY)
Admission: RE | Admit: 2015-07-09 | Discharge: 2015-07-09 | Disposition: A | Discharge status: Home / Self Care | Payer: Medicare Other | Source: Ambulatory Visit | Attending: Family Medicine | Admitting: Family Medicine

## 2015-07-09 DIAGNOSIS — Z1231 Encounter for screening mammogram for malignant neoplasm of breast: Secondary | ICD-10-CM | POA: Diagnosis not present

## 2015-08-07 ENCOUNTER — Encounter: Payer: Self-pay | Admitting: Cardiovascular Disease

## 2015-08-07 ENCOUNTER — Encounter: Payer: Self-pay | Admitting: *Deleted

## 2015-08-07 ENCOUNTER — Ambulatory Visit (INDEPENDENT_AMBULATORY_CARE_PROVIDER_SITE_OTHER): Payer: Medicare Other | Admitting: Cardiovascular Disease

## 2015-08-07 VITALS — BP 126/78 | HR 63 | Ht 66.0 in | Wt 198.0 lb

## 2015-08-07 DIAGNOSIS — R0609 Other forms of dyspnea: Secondary | ICD-10-CM | POA: Diagnosis not present

## 2015-08-07 DIAGNOSIS — R002 Palpitations: Secondary | ICD-10-CM

## 2015-08-07 DIAGNOSIS — F419 Anxiety disorder, unspecified: Secondary | ICD-10-CM

## 2015-08-07 DIAGNOSIS — R079 Chest pain, unspecified: Secondary | ICD-10-CM

## 2015-08-07 NOTE — Progress Notes (Signed)
Patient ID: [name], female   DOB: Mar 13, 1946, 69 y.o.   MRN: [id number]       CARDIOLOGY CONSULT NOTE  Patient ID: [name] MRN: [id number] DOB/AGE: 1946-11-23 69 y.o.  Admit date: (Not on file) Primary Physician Lanette Hampshire, MD  Reason for Consultation: valvular heart disease  HPI: 69 yr old woman who is a former patient of Dr. Rollene Fare. Echo on 04/27/14 showed normal LV systolic function, EF 14-48%, mild LVH, grade 1 diastolic dysfunction, trivial AI and TR. Has hyperlipidemia: TC 215, TG 197, LDL 126 on 04/27/14.  Also has anxiety and depression over death of daughter in 04/15/08 at age 81 from cancer.  Has GERD, esophagitis, and hypothyroidism.  Tells me she's had intermittent chest pain radiating into left arm x past few months. Can occur both with and without exertion, no particular pattern. Has also had exertional dyspnea x 3 months. Complains of leg and feet swelling. Says she has diffuse body aches due to fibromyalgia. Also c/o palpitations.  Says "I am under a lot of stress" and "things are stressing me out".  Mother age 27 with dementia.  Says she cannot walk on a treadmill.   No Known Allergies  Current Outpatient Prescriptions  Medication Sig Dispense Refill  . Cholecalciferol (VITAMIN D) 2000 UNITS CAPS Take 1 capsule by mouth daily.    Marland Kitchen esomeprazole (NEXIUM) 40 MG capsule Take 40 mg by mouth daily at 12 noon.    Marland Kitchen levothyroxine (SYNTHROID, LEVOTHROID) 75 MCG tablet Take 75 mcg by mouth daily before breakfast.     No current facility-administered medications for this visit.    Past Medical History  Diagnosis Date  . Hypothyroidism   . GERD (gastroesophageal reflux disease)   . Hypercholesterolemia     can't take meds, affects LFTs  . Esophageal stricture   . Gastritis   . Diverticulosis   . Heart valve problem   . OSA on CPAP     Past Surgical History  Procedure Laterality Date  . Total abdominal hysterectomy    . Cholecystectomy    .  Tubal ligation    . Thyroid surgery  16-Apr-2007    left  thyroid removed  . Appendectomy    . Esophagogastroduodenoscopy  09/28/11    mild gastritis/stricture in the distal esophagus/small gastric polyps benign    Social History   Social History  . Marital Status: Widowed    Spouse Name: N/A  . Number of Children: 3  . Years of Education: N/A   Occupational History  . retired     Psychologist, prison and probation services    Social History Main Topics  . Smoking status: Never Smoker   . Smokeless tobacco: Not on file  . Alcohol Use: No  . Drug Use: No  . Sexual Activity: Not on file   Other Topics Concern  . Not on file   Social History Narrative     No family history of premature CAD in 1st degree relatives.  Prior to Admission medications   Medication Sig Start Date End Date Taking? Authorizing Provider  Cholecalciferol (VITAMIN D) 2000 UNITS CAPS Take 1 capsule by mouth daily.   Yes Historical Provider, MD  esomeprazole (NEXIUM) 40 MG capsule Take 40 mg by mouth daily at 12 noon.   Yes Historical Provider, MD  levothyroxine (SYNTHROID, LEVOTHROID) 75 MCG tablet Take 75 mcg by mouth daily before breakfast.   Yes Historical Provider, MD     Review of systems complete and found to be  negative unless listed above in HPI     Physical exam Blood pressure 126/78, pulse 63, height 5\' 6"  (1.676 m), weight 198 lb (89.812 kg), SpO2 95 %. General: NAD Neck: No JVD, no thyromegaly or thyroid nodule.  Lungs: Clear to auscultation bilaterally (somewhat more diminished on right) with normal respiratory effort. CV: Nondisplaced PMI. Regular rate and rhythm, normal S1/S2, no S3/S4, no murmur.  No peripheral edema. Normal pedal pulses.  Abdomen: Soft, nontender, obese, no distention.  Skin: Intact without lesions or rashes.  Neurologic: Alert and oriented x 3.  Psych: Normal affect. Extremities: No clubbing or cyanosis.  HEENT: Normal.   ECG: Most recent ECG reviewed.  Labs:   Lab Results  Component  Value Date   WBC 6.1 04/26/2014   HGB 12.9 04/26/2014   HCT 38.7 04/26/2014   MCV 88.6 04/26/2014   PLT 263 04/26/2014   No results for input(s): NA, K, CL, CO2, BUN, CREATININE, CALCIUM, PROT, BILITOT, ALKPHOS, ALT, AST, GLUCOSE in the last 168 hours.  Invalid input(s): LABALBU Lab Results  Component Value Date   CKTOTAL 39 01/10/2010   CKMB 1.0 01/10/2010   TROPONINI <0.30 04/27/2014    Lab Results  Component Value Date   CHOL 215* 04/27/2014   CHOL  01/10/2010    157        ATP III CLASSIFICATION:  <200     mg/dL   Desirable  200-239  mg/dL   Borderline High  >=240    mg/dL   High          Lab Results  Component Value Date   HDL 50 04/27/2014   HDL 38* 01/10/2010   Lab Results  Component Value Date   LDLCALC 126* 04/27/2014   [place]  01/10/2010    99        Total Cholesterol/HDL:CHD Risk Coronary Heart Disease Risk Table                     Men   Women  1/2 Average Risk   3.4   3.3  Average Risk       5.0   4.4  2 X Average Risk   9.6   7.1  3 X Average Risk  23.4   11.0        Use the calculated Patient Ratio above and the CHD Risk Table to determine the patient's CHD Risk.        ATP III CLASSIFICATION (LDL):  <100     mg/dL   Optimal  100-129  mg/dL   Near or Above                    Optimal  130-159  mg/dL   Borderline  160-189  mg/dL   High  >190     mg/dL   Very High   Lab Results  Component Value Date   TRIG 197* 04/27/2014   TRIG 102 01/10/2010   Lab Results  Component Value Date   CHOLHDL 4.3 04/27/2014   CHOLHDL 4.1 01/10/2010   No results found for: LDLDIRECT       Studies: No results found.  ASSESSMENT AND PLAN:  1. Chest pain, palpitations, and exertional dyspnea: No significant valvular disease seen by most recent echocardiogram, and no murmurs appreciated today. Cannot walk on treadmill due to diffuse body pain from fibromyalgia. Will obtain Lexiscan Cardiolite to rule out ischemic etiology.  Dispo: f/u 4-6  weeks.   Signed: Kate Sable, M.D.,  F.A.C.C.  08/07/2015, 9:15 AM

## 2015-08-07 NOTE — Patient Instructions (Addendum)
Your physician recommends that you schedule a follow-up appointment in: 4-6 weeks with Dr. Bronson Ing.  Your physician recommends that you continue on your current medications as directed. Please refer to the Current Medication list given to you today.  Your physician has requested that you have a lexiscan myoview. For further information please visit [email or web address]. Please follow instruction sheet, as given.  Thank you for choosing [facility]!

## 2015-08-11 DIAGNOSIS — M5136 Other intervertebral disc degeneration, lumbar region: Secondary | ICD-10-CM | POA: Diagnosis not present

## 2015-08-14 ENCOUNTER — Encounter (HOSPITAL_COMMUNITY)
Admission: RE | Admit: 2015-08-14 | Discharge: 2015-08-14 | Disposition: A | Discharge status: Home / Self Care | Payer: Medicare Other | Source: Ambulatory Visit | Attending: Cardiovascular Disease | Admitting: Cardiovascular Disease

## 2015-08-14 ENCOUNTER — Inpatient Hospital Stay (HOSPITAL_COMMUNITY): Admission: RE | Admit: 2015-08-14 | Payer: Medicare Other | Source: Ambulatory Visit

## 2015-08-14 ENCOUNTER — Encounter (HOSPITAL_COMMUNITY): Payer: Medicare Other

## 2015-08-14 ENCOUNTER — Encounter: Payer: Self-pay | Admitting: *Deleted

## 2015-08-14 ENCOUNTER — Encounter (HOSPITAL_COMMUNITY): Payer: Self-pay

## 2015-08-14 ENCOUNTER — Ambulatory Visit (HOSPITAL_COMMUNITY): Payer: Medicare Other

## 2015-08-14 DIAGNOSIS — R079 Chest pain, unspecified: Secondary | ICD-10-CM | POA: Diagnosis not present

## 2015-08-14 DIAGNOSIS — R0609 Other forms of dyspnea: Secondary | ICD-10-CM | POA: Insufficient documentation

## 2015-08-14 LAB — NM MYOCAR MULTI W/SPECT W/WALL MOTION / EF
CHL CUP RESTING HR STRESS: 50 {beats}/min
LHR: 0.42
LV dias vol: 91 mL
LV sys vol: 32 mL
Peak HR: 93 {beats}/min
SDS: 1
SRS: 1
SSS: 2
TID: 1.05

## 2015-08-14 MED ORDER — TECHNETIUM TC 99M SESTAMIBI - CARDIOLITE
30.0000 | Freq: Once | INTRAVENOUS | Status: AC | PRN
  Administered 2015-08-14: 09:00:00 30 via INTRAVENOUS

## 2015-08-14 MED ORDER — TECHNETIUM TC 99M SESTAMIBI GENERIC - CARDIOLITE
10.0000 | Freq: Once | INTRAVENOUS | Status: AC | PRN
  Administered 2015-08-14: 10 via INTRAVENOUS

## 2015-08-14 MED ORDER — REGADENOSON 0.4 MG/5ML IV SOLN
INTRAVENOUS | Status: AC
  Administered 2015-08-14: 0.4 mg
  Filled 2015-08-14: qty 5

## 2015-09-07 DIAGNOSIS — Z23 Encounter for immunization: Secondary | ICD-10-CM | POA: Diagnosis not present

## 2015-09-22 DIAGNOSIS — E785 Hyperlipidemia, unspecified: Secondary | ICD-10-CM | POA: Diagnosis not present

## 2015-09-22 DIAGNOSIS — E039 Hypothyroidism, unspecified: Secondary | ICD-10-CM | POA: Diagnosis not present

## 2015-09-22 DIAGNOSIS — E559 Vitamin D deficiency, unspecified: Secondary | ICD-10-CM | POA: Diagnosis not present

## 2015-09-22 DIAGNOSIS — Z79899 Other long term (current) drug therapy: Secondary | ICD-10-CM | POA: Diagnosis not present

## 2015-09-25 ENCOUNTER — Ambulatory Visit (INDEPENDENT_AMBULATORY_CARE_PROVIDER_SITE_OTHER): Payer: Medicare Other | Admitting: Cardiovascular Disease

## 2015-09-25 ENCOUNTER — Encounter: Payer: Self-pay | Admitting: Cardiovascular Disease

## 2015-09-25 VITALS — BP 128/80 | HR 80 | Ht 66.0 in | Wt 196.0 lb

## 2015-09-25 DIAGNOSIS — Z136 Encounter for screening for cardiovascular disorders: Secondary | ICD-10-CM | POA: Diagnosis not present

## 2015-09-25 DIAGNOSIS — R079 Chest pain, unspecified: Secondary | ICD-10-CM | POA: Diagnosis not present

## 2015-09-25 DIAGNOSIS — F419 Anxiety disorder, unspecified: Secondary | ICD-10-CM | POA: Diagnosis not present

## 2015-09-25 DIAGNOSIS — R0609 Other forms of dyspnea: Secondary | ICD-10-CM | POA: Diagnosis not present

## 2015-09-25 DIAGNOSIS — IMO0001 Reserved for inherently not codable concepts without codable children: Secondary | ICD-10-CM

## 2015-09-25 NOTE — Patient Instructions (Signed)
Your physician recommends that you schedule a follow-up appointment in: as needed   Thank you for choosing [facility] !         

## 2015-09-25 NOTE — Progress Notes (Signed)
Patient ID: [name], female   DOB: 07-Oct-1946, 69 y.o.   MRN: [id number]      SUBJECTIVE: The patient returns for follow-up after undergoing cardiovascular testing performed for the evaluation of chest pain. Nuclear stress test on 08/14/15 was normal, calculated LVEF 55-65%. She no longer has any chest pain or shortness of breath and thinks her prior episodes were likely due to a significant amount of stress at home.  Review of Systems: As per "subjective", otherwise negative.  No Known Allergies  Current Outpatient Prescriptions  Medication Sig Dispense Refill  . Cholecalciferol (VITAMIN D) 2000 UNITS CAPS Take 1 capsule by mouth daily.    Marland Kitchen esomeprazole (NEXIUM) 40 MG capsule Take 40 mg by mouth daily at 12 noon.    Marland Kitchen levothyroxine (SYNTHROID, LEVOTHROID) 75 MCG tablet Take 75 mcg by mouth daily before breakfast.     No current facility-administered medications for this visit.    Past Medical History  Diagnosis Date  . Hypothyroidism   . GERD (gastroesophageal reflux disease)   . Hypercholesterolemia     can't take meds, affects LFTs  . Esophageal stricture   . Gastritis   . Diverticulosis   . Heart valve problem   . OSA on CPAP     Past Surgical History  Procedure Laterality Date  . Total abdominal hysterectomy    . Cholecystectomy    . Tubal ligation    . Thyroid surgery  2008    left  thyroid removed  . Appendectomy    . Esophagogastroduodenoscopy  09/28/11    mild gastritis/stricture in the distal esophagus/small gastric polyps benign    Social History   Social History  . Marital Status: Widowed    Spouse Name: N/A  . Number of Children: 3  . Years of Education: N/A   Occupational History  . retired     Psychologist, prison and probation services    Social History Main Topics  . Smoking status: Never Smoker   . Smokeless tobacco: Not on file  . Alcohol Use: No  . Drug Use: No  . Sexual Activity: Not on file   Other Topics Concern  . Not on file   Social History  Narrative     Filed Vitals:   09/25/15 1421  BP: 128/80  Pulse: 80  Height: 5\' 6"  (1.676 m)  Weight: 196 lb (88.905 kg)  SpO2: 93%    PHYSICAL EXAM General: NAD Neck: No JVD, no thyromegaly or thyroid nodule.  Lungs: Clear to auscultation bilaterally (somewhat more diminished on right) with normal respiratory effort. CV: Nondisplaced PMI. Regular rate and rhythm, normal S1/S2, no S3/S4, no murmur. No peripheral edema. Venous varicosities b/l. Normal pedal pulses.  Abdomen: Soft, nontender, obese, no distention.  Skin: Multiple hypopigmented areas on extremities. Neurologic: Alert and oriented x 3.  Psych: Normal affect. Extremities: No clubbing or cyanosis.  HEENT: Normal.   ECG: Most recent ECG reviewed.      ASSESSMENT AND PLAN: 1. Chest pain and shortness of breath: No further recurrences, with prior episodes likely due to stress and anxiety. Normal nuclear stress test as noted above.  Dispo: f/u prn.  Kate Sable, M.D., F.A.C.C.

## 2015-10-14 DIAGNOSIS — G4730 Sleep apnea, unspecified: Secondary | ICD-10-CM | POA: Diagnosis not present

## 2015-10-14 DIAGNOSIS — F419 Anxiety disorder, unspecified: Secondary | ICD-10-CM | POA: Diagnosis not present

## 2015-10-14 DIAGNOSIS — F329 Major depressive disorder, single episode, unspecified: Secondary | ICD-10-CM | POA: Diagnosis not present

## 2015-10-14 DIAGNOSIS — G4700 Insomnia, unspecified: Secondary | ICD-10-CM | POA: Diagnosis not present

## 2016-02-03 DIAGNOSIS — E782 Mixed hyperlipidemia: Secondary | ICD-10-CM | POA: Diagnosis not present

## 2016-02-03 DIAGNOSIS — R946 Abnormal results of thyroid function studies: Secondary | ICD-10-CM | POA: Diagnosis not present

## 2016-02-03 DIAGNOSIS — R6889 Other general symptoms and signs: Secondary | ICD-10-CM | POA: Diagnosis not present

## 2016-02-03 DIAGNOSIS — R7309 Other abnormal glucose: Secondary | ICD-10-CM | POA: Diagnosis not present

## 2016-02-10 ENCOUNTER — Emergency Department (HOSPITAL_COMMUNITY): Payer: Medicare Other

## 2016-02-10 ENCOUNTER — Emergency Department (HOSPITAL_COMMUNITY)
Admission: EM | Admit: 2016-02-10 | Discharge: 2016-02-10 | Disposition: A | Discharge status: Home / Self Care | Payer: Medicare Other | Attending: Emergency Medicine | Admitting: Emergency Medicine

## 2016-02-10 ENCOUNTER — Encounter (HOSPITAL_COMMUNITY): Payer: Self-pay

## 2016-02-10 DIAGNOSIS — R0789 Other chest pain: Secondary | ICD-10-CM | POA: Diagnosis not present

## 2016-02-10 DIAGNOSIS — R51 Headache: Secondary | ICD-10-CM | POA: Insufficient documentation

## 2016-02-10 DIAGNOSIS — E7800 Pure hypercholesterolemia, unspecified: Secondary | ICD-10-CM | POA: Diagnosis not present

## 2016-02-10 DIAGNOSIS — M545 Low back pain: Secondary | ICD-10-CM | POA: Diagnosis not present

## 2016-02-10 DIAGNOSIS — G8929 Other chronic pain: Secondary | ICD-10-CM | POA: Insufficient documentation

## 2016-02-10 DIAGNOSIS — E039 Hypothyroidism, unspecified: Secondary | ICD-10-CM | POA: Diagnosis not present

## 2016-02-10 DIAGNOSIS — Z79899 Other long term (current) drug therapy: Secondary | ICD-10-CM | POA: Diagnosis not present

## 2016-02-10 DIAGNOSIS — K219 Gastro-esophageal reflux disease without esophagitis: Secondary | ICD-10-CM | POA: Insufficient documentation

## 2016-02-10 DIAGNOSIS — R079 Chest pain, unspecified: Secondary | ICD-10-CM | POA: Diagnosis not present

## 2016-02-10 DIAGNOSIS — Z8679 Personal history of other diseases of the circulatory system: Secondary | ICD-10-CM | POA: Insufficient documentation

## 2016-02-10 DIAGNOSIS — G4733 Obstructive sleep apnea (adult) (pediatric): Secondary | ICD-10-CM | POA: Insufficient documentation

## 2016-02-10 LAB — BASIC METABOLIC PANEL
Anion gap: 10 (ref 5–15)
BUN: 15 mg/dL (ref 6–20)
CHLORIDE: 102 mmol/L (ref 101–111)
CO2: 28 mmol/L (ref 22–32)
Calcium: 9.3 mg/dL (ref 8.9–10.3)
Creatinine, Ser: 1.06 mg/dL — ABNORMAL HIGH (ref 0.44–1.00)
GFR calc Af Amer: >60 mL/min (ref >60)
GFR calc non Af Amer: 53 mL/min — ABNORMAL LOW (ref >60)
GLUCOSE: 123 mg/dL — AB (ref 65–99)
Potassium: 4 mmol/L (ref 3.5–5.1)
Sodium: 140 mmol/L (ref 135–145)

## 2016-02-10 LAB — CBC WITH DIFFERENTIAL/PLATELET
Basophils Absolute: 0.1 10*3/uL (ref 0.0–0.1)
Basophils Relative: 1 %
EOS ABS: 0.2 10*3/uL (ref 0.0–0.7)
Eosinophils Relative: 3 %
HEMATOCRIT: 40 % (ref 36.0–46.0)
Hemoglobin: 12.8 g/dL (ref 12.0–15.0)
LYMPHS ABS: 1.3 10*3/uL (ref 0.7–4.0)
LYMPHS PCT: 25 %
MCH: 28.5 pg (ref 26.0–34.0)
MCHC: 32 g/dL (ref 30.0–36.0)
MCV: 89.1 fL (ref 78.0–100.0)
MONOS PCT: 7 %
Monocytes Absolute: 0.4 10*3/uL (ref 0.1–1.0)
NEUTROS ABS: 3.3 10*3/uL (ref 1.7–7.7)
NEUTROS PCT: 64 %
Platelets: 289 10*3/uL (ref 150–400)
RBC: 4.49 MIL/uL (ref 3.87–5.11)
RDW: 13.5 % (ref 11.5–15.5)
WBC: 5.2 10*3/uL (ref 4.0–10.5)

## 2016-02-10 LAB — TROPONIN I: Troponin I: <0.03 ng/mL (ref <0.031)

## 2016-02-10 LAB — I-STAT TROPONIN, ED: Troponin i, poc: 0.01 ng/mL (ref 0.00–0.08)

## 2016-02-10 MED ORDER — GI COCKTAIL ~~LOC~~
ORAL | Status: AC
  Filled 2016-02-10: qty 30

## 2016-02-10 MED ORDER — ASPIRIN 81 MG PO CHEW
324.0000 mg | CHEWABLE_TABLET | Freq: Once | ORAL | Status: AC
  Administered 2016-02-10: 324 mg via ORAL
  Filled 2016-02-10: qty 4

## 2016-02-10 MED ORDER — HYDROCODONE-ACETAMINOPHEN 5-325 MG PO TABS
1.0000 | ORAL_TABLET | Freq: Once | ORAL | Status: AC
  Administered 2016-02-10: 1 via ORAL
  Filled 2016-02-10: qty 1

## 2016-02-10 MED ORDER — GI COCKTAIL ~~LOC~~
30.0000 mL | Freq: Once | ORAL | Status: AC
  Administered 2016-02-10: 30 mL via ORAL

## 2016-02-10 MED ORDER — HYDROCODONE-ACETAMINOPHEN 5-325 MG PO TABS: 1.0000 | ORAL_TABLET | Freq: Four times a day (QID) | ORAL | Status: DC | PRN

## 2016-02-10 NOTE — ED Notes (Signed)
Pt reports pain in left chest radiating to both arms since 1230 last night.  Denies sob, n/v.  Reports took nexium last night for the pain and an antacid liquid this morning but no relief.

## 2016-02-10 NOTE — Discharge Instructions (Signed)
Negative limited to follow-up with your doctors office. Today's workup without evidence of any acute cardiac event. Take pain medicine as directed. Return for any new or worse symptoms

## 2016-02-10 NOTE — ED Provider Notes (Addendum)
CSN: [id number]     Arrival date & time 02/10/16  1035 History  By signing my name below, I, Eustaquio Maize, attest that this documentation has been prepared under the direction and in the presence of Fredia Sorrow, MD. Electronically Signed: Eustaquio Maize, ED Scribe. 02/10/2016. 11:17 AM.   Chief Complaint  Patient presents with  . Chest Pain   Patient is a 70 y.o. female presenting with chest pain. The history is provided by the patient. No language interpreter was used.  Chest Pain Pain location:  L chest Pain quality: pressure   Pain radiates to:  L arm and R arm Pain radiates to the back: no   Pain severity now: 8/10. Onset quality:  Sudden Duration:  11 hours Timing:  Constant Chronicity:  New Ineffective treatments: GERD medication. Associated symptoms: back pain (chronic) and headache   Associated symptoms: no abdominal pain, no cough, no fever, no nausea, no shortness of breath and not vomiting      HPI Comments: [name] is a 70 y.o. female who presents to the Emergency Department complaining of sudden onset, constant, 8/10, pressure, left sided chest pain radiating to BUEs that began at 12 AM (approximately 11 hours ago). Pt attributed the chest pain to GERD and took her medication without relief, prompting her to come to the ED for further evaluation. Denies nausea, vomiting, shortness of breath, or any other associated symptoms.   Cardiologist - Bronson Ing   Past Medical History  Diagnosis Date  . Hypothyroidism   . GERD (gastroesophageal reflux disease)   . Hypercholesterolemia     can't take meds, affects LFTs  . Esophageal stricture   . Gastritis   . Diverticulosis   . Heart valve problem   . OSA on CPAP    Past Surgical History  Procedure Laterality Date  . Total abdominal hysterectomy    . Cholecystectomy    . Tubal ligation    . Thyroid surgery  2008    left  thyroid removed  . Appendectomy    . Esophagogastroduodenoscopy  09/28/11    mild  gastritis/stricture in the distal esophagus/small gastric polyps benign   Family History  Problem Relation Age of Onset  . Colon cancer Mother     in remission, diagnosed age 1  . Anesthesia problems Neg Hx   . Hypotension Neg Hx   . Malignant hyperthermia Neg Hx   . Pseudochol deficiency Neg Hx    Social History  Substance Use Topics  . Smoking status: Never Smoker   . Smokeless tobacco: None  . Alcohol Use: No   OB History    No data available     Review of Systems  Constitutional: Negative for fever and chills.  HENT: Negative for congestion, rhinorrhea and sore throat.   Eyes: Negative for visual disturbance.  Respiratory: Negative for cough and shortness of breath.   Cardiovascular: Positive for chest pain. Negative for leg swelling.  Gastrointestinal: Negative for nausea, vomiting, abdominal pain and diarrhea.  Genitourinary: Negative for dysuria and hematuria.  Musculoskeletal: Positive for back pain (chronic). Negative for neck pain.  Skin: Negative for rash.  Neurological: Positive for headaches.  Hematological: Does not bruise/bleed easily.  Psychiatric/Behavioral: Negative for confusion.      Allergies  Review of patient's allergies indicates no known allergies.  Home Medications   Prior to Admission medications   Medication Sig Start Date End Date Taking? Authorizing Provider  Cholecalciferol (VITAMIN D) 2000 UNITS CAPS Take 1 capsule by mouth daily.  Yes Historical Provider, MD  esomeprazole (NEXIUM) 40 MG capsule Take 40 mg by mouth daily at 12 noon.   Yes Historical Provider, MD  levothyroxine (SYNTHROID, LEVOTHROID) 75 MCG tablet Take 75 mcg by mouth daily before breakfast.   Yes Historical Provider, MD  simvastatin (ZOCOR) 20 MG tablet Take 20 mg by mouth daily.   Yes Historical Provider, MD  HYDROcodone-acetaminophen (NORCO/VICODIN) 5-325 MG tablet Take 1 tablet by mouth every 6 (six) hours as needed for moderate pain. 02/10/16   Fredia Sorrow, MD    BP 166/87 mmHg  Pulse 54  Temp(Src) 98.2 F (36.8 C) (Oral)  Resp 24  Ht 5\' 6"  (1.676 m)  Wt 77.111 kg  BMI 27.45 kg/m2  SpO2 95%   Physical Exam  Constitutional: She is oriented to person, place, and time. She appears well-developed and well-nourished. No distress.  HENT:  Head: Normocephalic and atraumatic.  Mouth/Throat: Mucous membranes are normal.  Eyes: Conjunctivae and EOM are normal. Pupils are equal, round, and reactive to light. Right eye exhibits no discharge. Left eye exhibits no discharge.  Neck: Neck supple. No tracheal deviation present.  Cardiovascular: Normal rate, regular rhythm and normal heart sounds.   Pulmonary/Chest: Effort normal and breath sounds normal. No respiratory distress. She has no wheezes. She has no rales. She exhibits no tenderness.  Abdominal: Soft. Bowel sounds are normal. There is no tenderness.  Musculoskeletal: Normal range of motion. She exhibits no edema.  Neurological: She is alert and oriented to person, place, and time.  Skin: Skin is warm and dry.  Psychiatric: She has a normal mood and affect. Her behavior is normal.  Nursing note and vitals reviewed.   ED Course  Procedures (including critical care time)  DIAGNOSTIC STUDIES: Oxygen Saturation is 96% on RA, normal by my interpretation.    COORDINATION OF CARE: 11:15 AM-Discussed treatment plan which includes CXR, BMP, CBC, Troponin with pt at bedside and pt agreed to plan.   Labs Review Labs Reviewed  BASIC METABOLIC PANEL - Abnormal; Notable for the following:    Glucose, Bld 123 (*)    Creatinine, Ser 1.06 (*)    GFR calc non Af Amer 53 (*)    All other components within normal limits  CBC WITH DIFFERENTIAL/PLATELET  TROPONIN I  I-STAT TROPOININ, ED   Results for orders placed or performed during the hospital encounter of 02/10/16  CBC with Differential  Result Value Ref Range   WBC 5.2 4.0 - 10.5 K/uL   RBC 4.49 3.87 - 5.11 MIL/uL   Hemoglobin 12.8 12.0 - 15.0  g/dL   HCT 40.0 36.0 - 46.0 %   MCV 89.1 78.0 - 100.0 fL   MCH 28.5 26.0 - 34.0 pg   MCHC 32.0 30.0 - 36.0 g/dL   RDW 13.5 11.5 - 15.5 %   Platelets 289 150 - 400 K/uL   Neutrophils Relative % 64 %   Neutro Abs 3.3 1.7 - 7.7 K/uL   Lymphocytes Relative 25 %   Lymphs Abs 1.3 0.7 - 4.0 K/uL   Monocytes Relative 7 %   Monocytes Absolute 0.4 0.1 - 1.0 K/uL   Eosinophils Relative 3 %   Eosinophils Absolute 0.2 0.0 - 0.7 K/uL   Basophils Relative 1 %   Basophils Absolute 0.1 0.0 - 0.1 K/uL  Troponin I  Result Value Ref Range   Troponin I <0.03 <0.031 ng/mL  Basic metabolic panel  Result Value Ref Range   Sodium 140 135 - 145 mmol/L  Potassium 4.0 3.5 - 5.1 mmol/L   Chloride 102 101 - 111 mmol/L   CO2 28 22 - 32 mmol/L   Glucose, Bld 123 (H) 65 - 99 mg/dL   BUN 15 6 - 20 mg/dL   Creatinine, Ser 1.06 (H) 0.44 - 1.00 mg/dL   Calcium 9.3 8.9 - 10.3 mg/dL   GFR calc non Af Amer 53 (L) >60 mL/min   GFR calc Af Amer >60 >60 mL/min   Anion gap 10 5 - 15  I-Stat Troponin, ED (not at [facility])  Result Value Ref Range   Troponin i, poc 0.01 0.00 - 0.08 ng/mL   Comment 3             Imaging Review Dg Chest Portable 1 View  02/10/2016  CLINICAL DATA:  Central chest pain radiating to both arms, 11 hours duration. EXAM: PORTABLE CHEST 1 VIEW COMPARISON:  04/26/2014 FINDINGS: The heart is mildly enlarged. Mediastinal shadows are normal. The lungs are clear. The vascularity is normal. No effusions. IMPRESSION: Cardiomegaly.  No active disease. Electronically Signed   By: Nelson Chimes M.D.   On: 02/10/2016 11:03   I have personally reviewed and evaluated these images and lab results as part of my medical decision-making.   EKG Interpretation   Date/Time:  Wednesday February 10 2016 10:41:29 EST Ventricular Rate:  60 PR Interval:  154 QRS Duration: 98 QT Interval:  452 QTC Calculation: 452 R Axis:   21 Text Interpretation:  Sinus rhythm Low voltage, precordial leads  Nonspecific T  abnormalities, anterior leads Baseline wander in lead(s) I  II aVR Confirmed by Cheron Coryell  MD, Portia Wisdom ([id number]) on 02/10/2016 10:45:59 AM      MDM   Final diagnoses:  Chest pain, unspecified chest pain type    Patient with onset of left-sided chest pain constant since midnight. Patient's had 2 troponins that are negative. Patient still having pain. Can now it's been not well over 12 hours worth of pain with no change in troponin. Will treat as nonspecific chest pain with pain medicine try GI cocktail without any improvement. We'll have patient follow-up with her regular doctor. Return for any new or worse symptoms.  In addition patient was given aspirin here no change.  EKG also without any acute changes. Basic labs without any significant abnormalities. No significant anemia no leukocytosis. Chest x-ray negative for pneumonia pneumothorax or pulmonary edema.  I personally performed the services described in this documentation, which was scribed in my presence. The recorded information has been reviewed and is accurate.        Fredia Sorrow, MD 02/10/16 [place], MD 02/10/16 [place], MD 02/10/16 1424

## 2016-03-28 DIAGNOSIS — M797 Fibromyalgia: Secondary | ICD-10-CM | POA: Diagnosis not present

## 2016-03-28 DIAGNOSIS — G4730 Sleep apnea, unspecified: Secondary | ICD-10-CM | POA: Diagnosis not present

## 2016-03-28 DIAGNOSIS — F419 Anxiety disorder, unspecified: Secondary | ICD-10-CM | POA: Diagnosis not present

## 2016-03-28 DIAGNOSIS — E119 Type 2 diabetes mellitus without complications: Secondary | ICD-10-CM | POA: Diagnosis not present

## 2016-05-30 DIAGNOSIS — F419 Anxiety disorder, unspecified: Secondary | ICD-10-CM | POA: Diagnosis not present

## 2016-05-30 DIAGNOSIS — G4730 Sleep apnea, unspecified: Secondary | ICD-10-CM | POA: Diagnosis not present

## 2016-05-30 DIAGNOSIS — G4700 Insomnia, unspecified: Secondary | ICD-10-CM | POA: Diagnosis not present

## 2016-05-30 DIAGNOSIS — E119 Type 2 diabetes mellitus without complications: Secondary | ICD-10-CM | POA: Diagnosis not present

## 2016-07-12 ENCOUNTER — Other Ambulatory Visit (HOSPITAL_COMMUNITY): Payer: Self-pay | Admitting: Pulmonary Disease

## 2016-07-12 DIAGNOSIS — Z1231 Encounter for screening mammogram for malignant neoplasm of breast: Secondary | ICD-10-CM

## 2016-07-20 ENCOUNTER — Ambulatory Visit (HOSPITAL_COMMUNITY)
Admission: RE | Admit: 2016-07-20 | Discharge: 2016-07-20 | Disposition: A | Discharge status: Home / Self Care | Payer: Medicare Other | Source: Ambulatory Visit | Attending: Pulmonary Disease | Admitting: Pulmonary Disease

## 2016-07-20 DIAGNOSIS — Z1231 Encounter for screening mammogram for malignant neoplasm of breast: Secondary | ICD-10-CM | POA: Insufficient documentation

## 2016-08-30 DIAGNOSIS — E785 Hyperlipidemia, unspecified: Secondary | ICD-10-CM | POA: Diagnosis not present

## 2016-08-30 DIAGNOSIS — G4733 Obstructive sleep apnea (adult) (pediatric): Secondary | ICD-10-CM | POA: Diagnosis not present

## 2016-08-30 DIAGNOSIS — E039 Hypothyroidism, unspecified: Secondary | ICD-10-CM | POA: Diagnosis not present

## 2016-08-30 DIAGNOSIS — F329 Major depressive disorder, single episode, unspecified: Secondary | ICD-10-CM | POA: Diagnosis not present

## 2016-09-13 DIAGNOSIS — Z23 Encounter for immunization: Secondary | ICD-10-CM | POA: Diagnosis not present

## 2016-10-12 DIAGNOSIS — K210 Gastro-esophageal reflux disease with esophagitis: Secondary | ICD-10-CM | POA: Diagnosis not present

## 2016-10-12 DIAGNOSIS — M545 Low back pain: Secondary | ICD-10-CM | POA: Diagnosis not present

## 2016-12-28 DIAGNOSIS — G4733 Obstructive sleep apnea (adult) (pediatric): Secondary | ICD-10-CM | POA: Diagnosis not present

## 2016-12-28 DIAGNOSIS — E039 Hypothyroidism, unspecified: Secondary | ICD-10-CM | POA: Diagnosis not present

## 2016-12-28 DIAGNOSIS — E119 Type 2 diabetes mellitus without complications: Secondary | ICD-10-CM | POA: Diagnosis not present

## 2016-12-28 DIAGNOSIS — E785 Hyperlipidemia, unspecified: Secondary | ICD-10-CM | POA: Diagnosis not present

## 2016-12-28 DIAGNOSIS — R739 Hyperglycemia, unspecified: Secondary | ICD-10-CM | POA: Diagnosis not present

## 2016-12-28 DIAGNOSIS — F329 Major depressive disorder, single episode, unspecified: Secondary | ICD-10-CM | POA: Diagnosis not present

## 2017-01-31 DIAGNOSIS — H26492 Other secondary cataract, left eye: Secondary | ICD-10-CM | POA: Diagnosis not present

## 2017-01-31 DIAGNOSIS — H26491 Other secondary cataract, right eye: Secondary | ICD-10-CM | POA: Diagnosis not present

## 2017-01-31 DIAGNOSIS — H524 Presbyopia: Secondary | ICD-10-CM | POA: Diagnosis not present

## 2017-01-31 DIAGNOSIS — Z961 Presence of intraocular lens: Secondary | ICD-10-CM | POA: Diagnosis not present

## 2017-02-21 ENCOUNTER — Encounter (HOSPITAL_COMMUNITY)
Admission: RE | Disposition: A | Discharge status: Home / Self Care | Payer: Self-pay | Source: Ambulatory Visit | Attending: Ophthalmology

## 2017-02-21 ENCOUNTER — Ambulatory Visit (HOSPITAL_COMMUNITY)
Admission: RE | Admit: 2017-02-21 | Discharge: 2017-02-21 | Disposition: A | Discharge status: Home / Self Care | Payer: Medicare Other | Source: Ambulatory Visit | Attending: Ophthalmology | Admitting: Ophthalmology

## 2017-02-21 DIAGNOSIS — H2640 Unspecified secondary cataract: Secondary | ICD-10-CM | POA: Diagnosis present

## 2017-02-21 DIAGNOSIS — H26491 Other secondary cataract, right eye: Secondary | ICD-10-CM | POA: Insufficient documentation

## 2017-02-21 DIAGNOSIS — Z79899 Other long term (current) drug therapy: Secondary | ICD-10-CM | POA: Diagnosis not present

## 2017-02-21 HISTORY — PX: YAG LASER APPLICATION: SHX6189

## 2017-02-21 SURGERY — TREATMENT, USING YAG LASER
Anesthesia: LOCAL | Laterality: Right

## 2017-02-21 MED ORDER — CYCLOPENTOLATE-PHENYLEPHRINE 0.2-1 % OP SOLN
OPHTHALMIC | Status: AC
  Filled 2017-02-21: qty 2

## 2017-02-21 MED ORDER — CYCLOPENTOLATE-PHENYLEPHRINE 0.2-1 % OP SOLN
1.0000 [drp] | OPHTHALMIC | Status: AC
  Administered 2017-02-21 (×3): 1 [drp] via OPHTHALMIC

## 2017-02-21 NOTE — Op Note (Signed)
Tenoch Mcclure T. Gershon Crane, MD  Procedure: Yag Capsulotomy  Yag Laser Self Test Completedyes. Procedure: Posterior Capsulotomy, Eye Protection Worn by Staff yes. Laser In Use Sign on Door yes.  Laser: Nd:YAG Spot Size: Fixed Burst Mode: III Power Setting: 3.4 mJ/burst Number of shots: 28 Total energy delivered: 89.38 mJ   The patient tolerated the procedure without difficulty. No complications were encountered.   The patient was discharged home with the instructions to continue all her current glaucoma medications, if any.   Patient instructed to go to office at 0100 for intraocular pressure check.  Patient verbalizes understanding of discharge instructions Yes.  .    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

## 2017-02-21 NOTE — H&P (Signed)
The patient was re examined and there is no change in the patients condition since the original H and P. 

## 2017-02-21 NOTE — Discharge Instructions (Signed)
IMAAN RIPPER  02/21/2017     Instructions    Activity: No Restrictions.   Diet: Resume Diet you were on at home.   Pain Medication: Tylenol if Needed.   CONTACT YOUR DOCTOR IF YOU HAVE PAIN, REDNESS IN YOUR EYE, OR DECREASED VISION.   Follow-up:today at 1:45pm with Rutherford Guys, MD.   Dr. Gershon Crane: [phone]    If you find that you cannot contact your physician, but feel that your signs and   Symptoms warrant a physician's attention, call the Emergency Room at   [phone] ext.[phone].

## 2017-02-22 ENCOUNTER — Encounter (HOSPITAL_COMMUNITY): Payer: Self-pay | Admitting: Ophthalmology

## 2017-03-31 DIAGNOSIS — J441 Chronic obstructive pulmonary disease with (acute) exacerbation: Secondary | ICD-10-CM | POA: Diagnosis not present

## 2017-03-31 DIAGNOSIS — G4700 Insomnia, unspecified: Secondary | ICD-10-CM | POA: Diagnosis not present

## 2017-03-31 DIAGNOSIS — E119 Type 2 diabetes mellitus without complications: Secondary | ICD-10-CM | POA: Diagnosis not present

## 2017-03-31 DIAGNOSIS — G4733 Obstructive sleep apnea (adult) (pediatric): Secondary | ICD-10-CM | POA: Diagnosis not present

## 2017-04-18 ENCOUNTER — Ambulatory Visit (HOSPITAL_COMMUNITY)
Admission: RE | Admit: 2017-04-18 | Discharge: 2017-04-18 | Disposition: A | Discharge status: Home / Self Care | Payer: Medicare Other | Source: Ambulatory Visit | Attending: Ophthalmology | Admitting: Ophthalmology

## 2017-04-18 ENCOUNTER — Encounter (HOSPITAL_COMMUNITY)
Admission: RE | Disposition: A | Discharge status: Home / Self Care | Payer: Self-pay | Source: Ambulatory Visit | Attending: Ophthalmology

## 2017-04-18 ENCOUNTER — Encounter (HOSPITAL_COMMUNITY): Payer: Self-pay | Admitting: *Deleted

## 2017-04-18 DIAGNOSIS — H26492 Other secondary cataract, left eye: Secondary | ICD-10-CM | POA: Diagnosis not present

## 2017-04-18 DIAGNOSIS — Z79899 Other long term (current) drug therapy: Secondary | ICD-10-CM | POA: Diagnosis not present

## 2017-04-18 DIAGNOSIS — H2640 Unspecified secondary cataract: Secondary | ICD-10-CM | POA: Insufficient documentation

## 2017-04-18 HISTORY — PX: YAG LASER APPLICATION: SHX6189

## 2017-04-18 SURGERY — TREATMENT, USING YAG LASER
Anesthesia: LOCAL | Laterality: Left

## 2017-04-18 MED ORDER — CYCLOPENTOLATE-PHENYLEPHRINE 0.2-1 % OP SOLN
1.0000 [drp] | OPHTHALMIC | Status: AC
  Administered 2017-04-18 (×3): 1 [drp] via OPHTHALMIC

## 2017-04-18 MED ORDER — CYCLOPENTOLATE-PHENYLEPHRINE 0.2-1 % OP SOLN
OPHTHALMIC | Status: AC
  Filled 2017-04-18: qty 2

## 2017-04-18 NOTE — H&P (Signed)
The patient was re examined and there is no change in the patients condition since the original H and P. 

## 2017-04-18 NOTE — Op Note (Signed)
Damante Spragg T. Gershon Crane, MD  Procedure: Yag Capsulotomy  Yag Laser Self Test Completedyes. Procedure: Posterior Capsulotomy, Eye Protection Worn by Staff yes. Laser In Use Sign on Door yes.  Laser: Nd:YAG Spot Size: Fixed Burst Mode: III Power Setting: 3.4 mJ/burst Number of shots: 42 Total energy delivered: 136.77 mJ   The patient tolerated the procedure without difficulty. No complications were encountered.   The patient was discharged home with the instructions to continue all her current glaucoma medications, if any.   Patient instructed to go to office at 0100 for intraocular pressure check.  Patient verbalizes understanding of discharge instructions Yes.  .    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

## 2017-04-18 NOTE — Discharge Instructions (Signed)
[name]  04/18/2017     Instructions    Activity: No Restrictions.   Diet: Resume Diet you were on at home.   Pain Medication: Tylenol if Needed.   CONTACT YOUR DOCTOR IF YOU HAVE PAIN, REDNESS IN YOUR EYE, OR DECREASED VISION.   Follow-up:today at 1:00 pm with Rutherford Guys, MD.   Dr. Gershon Crane: [phone]   If you find that you cannot contact your physician, but feel that your signs and   Symptoms warrant a physician's attention, call the Emergency Room at   [phone] [phone].   Other{NA AND [id number]}.

## 2017-04-20 ENCOUNTER — Encounter (HOSPITAL_COMMUNITY): Payer: Self-pay | Admitting: Ophthalmology

## 2017-06-12 ENCOUNTER — Ambulatory Visit (HOSPITAL_COMMUNITY)
Admission: RE | Admit: 2017-06-12 | Discharge: 2017-06-12 | Disposition: A | Discharge status: Home / Self Care | Payer: Medicare Other | Source: Ambulatory Visit | Attending: Pulmonary Disease | Admitting: Pulmonary Disease

## 2017-06-12 ENCOUNTER — Other Ambulatory Visit (HOSPITAL_COMMUNITY): Payer: Self-pay | Admitting: Pulmonary Disease

## 2017-06-12 DIAGNOSIS — M25562 Pain in left knee: Secondary | ICD-10-CM | POA: Diagnosis not present

## 2017-06-12 DIAGNOSIS — M1711 Unilateral primary osteoarthritis, right knee: Secondary | ICD-10-CM | POA: Diagnosis not present

## 2017-06-12 DIAGNOSIS — M79671 Pain in right foot: Secondary | ICD-10-CM | POA: Diagnosis not present

## 2017-06-12 DIAGNOSIS — M25572 Pain in left ankle and joints of left foot: Secondary | ICD-10-CM | POA: Insufficient documentation

## 2017-06-12 DIAGNOSIS — E119 Type 2 diabetes mellitus without complications: Secondary | ICD-10-CM | POA: Diagnosis not present

## 2017-06-12 DIAGNOSIS — M25571 Pain in right ankle and joints of right foot: Secondary | ICD-10-CM

## 2017-06-12 DIAGNOSIS — M79672 Pain in left foot: Secondary | ICD-10-CM

## 2017-06-12 DIAGNOSIS — M79673 Pain in unspecified foot: Secondary | ICD-10-CM | POA: Diagnosis not present

## 2017-06-12 DIAGNOSIS — R609 Edema, unspecified: Secondary | ICD-10-CM | POA: Diagnosis not present

## 2017-06-12 DIAGNOSIS — F609 Personality disorder, unspecified: Secondary | ICD-10-CM | POA: Diagnosis not present

## 2017-06-12 DIAGNOSIS — M25569 Pain in unspecified knee: Secondary | ICD-10-CM | POA: Diagnosis not present

## 2017-06-12 DIAGNOSIS — M170 Bilateral primary osteoarthritis of knee: Secondary | ICD-10-CM | POA: Diagnosis not present

## 2017-06-12 DIAGNOSIS — M25559 Pain in unspecified hip: Secondary | ICD-10-CM | POA: Diagnosis not present

## 2017-06-30 DIAGNOSIS — G4700 Insomnia, unspecified: Secondary | ICD-10-CM | POA: Diagnosis not present

## 2017-06-30 DIAGNOSIS — R785 Finding of other psychotropic drug in blood: Secondary | ICD-10-CM | POA: Diagnosis not present

## 2017-06-30 DIAGNOSIS — E039 Hypothyroidism, unspecified: Secondary | ICD-10-CM | POA: Diagnosis not present

## 2017-06-30 DIAGNOSIS — R079 Chest pain, unspecified: Secondary | ICD-10-CM | POA: Diagnosis not present

## 2017-06-30 DIAGNOSIS — G4733 Obstructive sleep apnea (adult) (pediatric): Secondary | ICD-10-CM | POA: Diagnosis not present

## 2017-07-03 ENCOUNTER — Other Ambulatory Visit (HOSPITAL_COMMUNITY): Payer: Self-pay | Admitting: Pulmonary Disease

## 2017-07-03 DIAGNOSIS — R079 Chest pain, unspecified: Secondary | ICD-10-CM

## 2017-07-04 ENCOUNTER — Ambulatory Visit (INDEPENDENT_AMBULATORY_CARE_PROVIDER_SITE_OTHER): Payer: Medicare Other | Admitting: Cardiovascular Disease

## 2017-07-04 ENCOUNTER — Ambulatory Visit: Payer: Medicare Other | Admitting: Cardiovascular Disease

## 2017-07-04 ENCOUNTER — Encounter: Payer: Self-pay | Admitting: Cardiovascular Disease

## 2017-07-04 ENCOUNTER — Encounter: Payer: Self-pay | Admitting: *Deleted

## 2017-07-04 VITALS — BP 158/82 | HR 55 | Ht 66.0 in | Wt 202.6 lb

## 2017-07-04 DIAGNOSIS — I10 Essential (primary) hypertension: Secondary | ICD-10-CM | POA: Diagnosis not present

## 2017-07-04 DIAGNOSIS — R600 Localized edema: Secondary | ICD-10-CM

## 2017-07-04 DIAGNOSIS — R0609 Other forms of dyspnea: Secondary | ICD-10-CM | POA: Diagnosis not present

## 2017-07-04 DIAGNOSIS — R079 Chest pain, unspecified: Secondary | ICD-10-CM

## 2017-07-04 DIAGNOSIS — F419 Anxiety disorder, unspecified: Secondary | ICD-10-CM | POA: Diagnosis not present

## 2017-07-04 MED ORDER — LOSARTAN POTASSIUM 25 MG PO TABS: 25.0000 mg | ORAL_TABLET | Freq: Every day | ORAL | 3 refills | Status: DC

## 2017-07-04 NOTE — Progress Notes (Signed)
SUBJECTIVE: The patient presents for evaluation of chest pain. I last saw her in September 2016. She underwent a nuclear stress test which was normal on 08/14/15, LVEF 55-65%. At that time it was felt that her symptoms were due to a significant amount of stress at home.  She saw her PCP, Dr. Luan Pulling, on 06/30/17. At that time she complained of exertional chest discomfort. She has fibromyalgia. She is also had some recent leg swelling and was placed on a diuretic.  I personally reviewed the ECG performed at her PCPs office which demonstrated sinus rhythm with nonspecific T-wave abnormalities primarily in the precordial leads.  An echocardiogram was ordered by Dr. Luan Pulling but has yet to be performed.  She has been undergoing a lot of stress. One daughter died at age 90 in 07-Apr-2008 and her birthday was [birth year]. Another daughter has a brain tumor and initially underwent surgery for this some years ago on July 8. She has now had brain tumor recurrence.  She has chest pain both with exertion and when lying down at night. She also feels radiation of this pain down her left arm with tingling and numbness. She feels that her left arm is weaker than her right arm. She also complains of exertional dyspnea when climbing stairs.  She does not feel the diuretic has helped her leg edema.  She says she has a weak bladder ever since hysterectomy.  Blood pressure 158/82.    Review of Systems: As per "subjective", otherwise negative.  No Known Allergies  Current Outpatient Prescriptions  Medication Sig Dispense Refill  . ALPRAZolam (XANAX) 0.5 MG tablet Take 0.5 mg by mouth at bedtime as needed for anxiety.    . cholecalciferol (VITAMIN D) 1000 units tablet Take 1,000 Units by mouth daily.    Marland Kitchen esomeprazole (NEXIUM) 40 MG capsule Take 40 mg by mouth daily as needed.     . furosemide (LASIX) 40 MG tablet Take 40 mg by mouth daily as needed.    Marland Kitchen levothyroxine (SYNTHROID, LEVOTHROID) 75 MCG tablet Take  75 mcg by mouth daily before breakfast.    . simvastatin (ZOCOR) 20 MG tablet Take 20 mg by mouth daily.     No current facility-administered medications for this visit.     Past Medical History:  Diagnosis Date  . Diverticulosis   . Esophageal stricture   . Gastritis   . GERD (gastroesophageal reflux disease)   . Heart valve problem   . Hypercholesterolemia    can't take meds, affects LFTs  . Hypothyroidism   . OSA on CPAP     Past Surgical History:  Procedure Laterality Date  . APPENDECTOMY    . CHOLECYSTECTOMY    . ESOPHAGOGASTRODUODENOSCOPY  09/28/11   mild gastritis/stricture in the distal esophagus/small gastric polyps benign  . THYROID SURGERY  04-08-07   left  thyroid removed  . TOTAL ABDOMINAL HYSTERECTOMY    . TUBAL LIGATION    . YAG LASER APPLICATION Right 6/57/8469   Procedure: YAG LASER APPLICATION;  Surgeon: Rutherford Guys, MD;  Location: [facility];  Service: Ophthalmology;  Laterality: Right;  . YAG LASER APPLICATION Left 06/24/5283   Procedure: YAG LASER APPLICATION;  Surgeon: Rutherford Guys, MD;  Location: [facility];  Service: Ophthalmology;  Laterality: Left;    Social History   Social History  . Marital status: Widowed    Spouse name: N/A  . Number of children: 3  . Years of education: N/A   Occupational History  .  retired     Psychologist, prison and probation services    Social History Main Topics  . Smoking status: Never Smoker  . Smokeless tobacco: Never Used  . Alcohol use No  . Drug use: No  . Sexual activity: Not on file   Other Topics Concern  . Not on file   Social History Narrative  . No narrative on file     Vitals:   07/04/17 1123  BP: (!) 158/82  Pulse: (!) 55  SpO2: 96%  Weight: 202 lb 9.6 oz (91.9 kg)  Height: 5\' 6"  (1.676 m)    Wt Readings from Last 3 Encounters:  07/04/17 202 lb 9.6 oz (91.9 kg)  02/10/16 170 lb (77.1 kg)  09/25/15 196 lb (88.9 kg)     PHYSICAL EXAM General: NAD HEENT: Normal. Neck: No JVD, no thyromegaly. Lungs: Clear to  auscultation bilaterally with normal respiratory effort. CV: Nondisplaced PMI.  Regular rate and rhythm, normal S1/S2, no S3/S4, no murmur. + Bilateral periankle edema.  No carotid bruit.   Abdomen: Soft, nontender, no distention.  Neurologic: Alert and oriented.  Psych: Normal affect. Skin: Normal. Musculoskeletal: No Albo deformities.    ECG: Most recent ECG reviewed.   Labs: Lab Results  Component Value Date/Time   K 4.0 02/10/2016 10:49 AM   BUN 15 02/10/2016 10:49 AM   CREATININE 1.06 (H) 02/10/2016 10:49 AM   ALT 26 04/26/2014 12:20 PM   TSH 1.110 04/26/2014 12:20 PM   HGB 12.8 02/10/2016 10:49 AM     Lipids: Lab Results  Component Value Date/Time   LDLCALC 126 (H) 04/27/2014 06:16 AM   CHOL 215 (H) 04/27/2014 06:16 AM   TRIG 197 (H) 04/27/2014 06:16 AM   HDL 50 04/27/2014 06:16 AM       ASSESSMENT AND PLAN:  1. Chest pain and exertional dyspnea: All some of her symptoms may be provoked by significant anxiety and stress as detailed above, she does have cardiovascular risk factors which warrant evaluating for ischemic heart disease. I will proceed with a nuclear myocardial perfusion imaging study to evaluate for ischemic heart disease (Lexiscan Myoview). I will review her echocardiogram results once performed.  2. Hypertension: This is markedly elevated. I will start losartan 25 mg daily and check a basic metabolic panel within a week of initiation. I will not opt for chlorthalidone as she describes a history of urinary incontinence ever since hysterectomy.  3. Bilateral leg and feet edema: Some of this may be related to venous insufficiency. Hypertension may also be contracting. I will review her echocardiogram results once performed. I will aim to control her blood pressure.   Disposition: Follow up 6 weeks.  Time spent: 40 minutes, of which greater than 50% was spent reviewing symptoms, relevant blood tests and studies, and discussing management plan with the  patient.    Kate Sable, M.D., F.A.C.C.

## 2017-07-04 NOTE — Patient Instructions (Signed)
Medication Instructions:  Your physician has recommended you make the following change in your medication:  Begin Losartan 25 mg daily  Labwork: BMET IN 1 WEEK  Testing/Procedures: Your physician has requested that you have a lexiscan myoview. For further information please visit [email or web address]. Please follow instruction sheet, as given.  Follow-Up: Your physician recommends that you schedule a follow-up appointment in: [place] OFFICE  Any Other Special Instructions Will Be Listed Below (If Applicable).  If you need a refill on your cardiac medications before your next appointment, please call your pharmacy.

## 2017-07-05 ENCOUNTER — Ambulatory Visit (HOSPITAL_COMMUNITY)
Admission: RE | Admit: 2017-07-05 | Discharge: 2017-07-05 | Disposition: A | Discharge status: Home / Self Care | Payer: Medicare Other | Source: Ambulatory Visit | Attending: Pulmonary Disease | Admitting: Pulmonary Disease

## 2017-07-05 DIAGNOSIS — R079 Chest pain, unspecified: Secondary | ICD-10-CM | POA: Insufficient documentation

## 2017-07-05 DIAGNOSIS — I209 Angina pectoris, unspecified: Secondary | ICD-10-CM | POA: Insufficient documentation

## 2017-07-05 DIAGNOSIS — K219 Gastro-esophageal reflux disease without esophagitis: Secondary | ICD-10-CM | POA: Insufficient documentation

## 2017-07-05 DIAGNOSIS — I080 Rheumatic disorders of both mitral and aortic valves: Secondary | ICD-10-CM | POA: Diagnosis not present

## 2017-07-05 DIAGNOSIS — E039 Hypothyroidism, unspecified: Secondary | ICD-10-CM | POA: Diagnosis not present

## 2017-07-05 NOTE — Progress Notes (Signed)
*  PRELIMINARY RESULTS* Echocardiogram 2D Echocardiogram has been performed.  Leavy Cella 07/05/2017, 11:53 AM

## 2017-07-06 ENCOUNTER — Other Ambulatory Visit (HOSPITAL_COMMUNITY): Payer: Medicare Other

## 2017-07-10 ENCOUNTER — Other Ambulatory Visit (HOSPITAL_COMMUNITY): Payer: Medicare Other

## 2017-07-10 ENCOUNTER — Other Ambulatory Visit (HOSPITAL_COMMUNITY): Payer: Self-pay | Admitting: Pulmonary Disease

## 2017-07-10 DIAGNOSIS — Z1231 Encounter for screening mammogram for malignant neoplasm of breast: Secondary | ICD-10-CM

## 2017-07-12 ENCOUNTER — Encounter (HOSPITAL_BASED_OUTPATIENT_CLINIC_OR_DEPARTMENT_OTHER)
Admission: RE | Admit: 2017-07-12 | Discharge: 2017-07-12 | Disposition: A | Discharge status: Home / Self Care | Payer: Medicare Other | Source: Ambulatory Visit | Attending: Cardiovascular Disease | Admitting: Cardiovascular Disease

## 2017-07-12 ENCOUNTER — Encounter (HOSPITAL_COMMUNITY): Payer: Self-pay

## 2017-07-12 ENCOUNTER — Encounter (HOSPITAL_COMMUNITY)
Admission: RE | Admit: 2017-07-12 | Discharge: 2017-07-12 | Disposition: A | Discharge status: Home / Self Care | Payer: Medicare Other | Source: Ambulatory Visit | Attending: Cardiovascular Disease | Admitting: Cardiovascular Disease

## 2017-07-12 DIAGNOSIS — R079 Chest pain, unspecified: Secondary | ICD-10-CM

## 2017-07-12 DIAGNOSIS — R0609 Other forms of dyspnea: Secondary | ICD-10-CM | POA: Insufficient documentation

## 2017-07-12 LAB — NM MYOCAR MULTI W/SPECT W/WALL MOTION / EF
CHL CUP NUCLEAR SDS: 4
CHL CUP NUCLEAR SSS: 5
LHR: 0.26
LV dias vol: 92 mL (ref 46–106)
LV sys vol: 34 mL
Peak HR: 97 {beats}/min
Rest HR: 51 {beats}/min
SRS: 1
TID: 1.19

## 2017-07-12 MED ORDER — SODIUM CHLORIDE 0.9% FLUSH
INTRAVENOUS | Status: AC
  Administered 2017-07-12: 10 mL via INTRAVENOUS
  Filled 2017-07-12: qty 10

## 2017-07-12 MED ORDER — REGADENOSON 0.4 MG/5ML IV SOLN
INTRAVENOUS | Status: AC
  Administered 2017-07-12: 0.4 mg via INTRAVENOUS
  Filled 2017-07-12: qty 5

## 2017-07-12 MED ORDER — TECHNETIUM TC 99M TETROFOSMIN IV KIT
30.0000 | PACK | Freq: Once | INTRAVENOUS | Status: AC | PRN
  Administered 2017-07-12: 31.7 via INTRAVENOUS

## 2017-07-12 MED ORDER — TECHNETIUM TC 99M TETROFOSMIN IV KIT
10.0000 | PACK | Freq: Once | INTRAVENOUS | Status: AC | PRN
  Administered 2017-07-12: 10.7 via INTRAVENOUS

## 2017-07-13 ENCOUNTER — Telehealth: Payer: Self-pay | Admitting: *Deleted

## 2017-07-13 ENCOUNTER — Other Ambulatory Visit: Payer: Self-pay | Admitting: Cardiovascular Disease

## 2017-07-13 DIAGNOSIS — R079 Chest pain, unspecified: Secondary | ICD-10-CM | POA: Diagnosis not present

## 2017-07-13 NOTE — Telephone Encounter (Signed)
Notes recorded by Laurine Blazer, LPN on 6/34/9494 at 4:73 AM EDT Patient notified. Copy to pmd. Follow up already scheduled for 08/14/2017 with Dr. Bronson Ing in the [place] office. ------  Notes recorded by Herminio Commons, MD on 07/12/2017 at 3:36 PM EDT Low risk. Possbile small blockage vs artificial defect due to shadowing. Either way, will treat with meds.

## 2017-07-14 LAB — BASIC METABOLIC PANEL
BUN/Creatinine Ratio: 25 (ref 12–28)
BUN: 25 mg/dL (ref 8–27)
CALCIUM: 9.4 mg/dL (ref 8.7–10.3)
CHLORIDE: 99 mmol/L (ref 96–106)
CO2: 23 mmol/L (ref 20–29)
Creatinine, Ser: 1.01 mg/dL — ABNORMAL HIGH (ref 0.57–1.00)
GFR calc non Af Amer: 57 mL/min/{1.73_m2} — ABNORMAL LOW (ref >59)
GFR, EST AFRICAN AMERICAN: 65 mL/min/{1.73_m2} (ref >59)
GLUCOSE: 164 mg/dL — AB (ref 65–99)
POTASSIUM: 5.1 mmol/L (ref 3.5–5.2)
Sodium: 138 mmol/L (ref 134–144)

## 2017-07-14 LAB — AMBIG ABBREV BMP8 DEFAULT

## 2017-07-20 ENCOUNTER — Other Ambulatory Visit: Payer: Self-pay | Admitting: *Deleted

## 2017-07-20 DIAGNOSIS — Z79899 Other long term (current) drug therapy: Secondary | ICD-10-CM

## 2017-07-24 ENCOUNTER — Ambulatory Visit (HOSPITAL_COMMUNITY): Payer: Medicare Other

## 2017-07-24 ENCOUNTER — Telehealth: Payer: Self-pay | Admitting: *Deleted

## 2017-07-24 NOTE — Telephone Encounter (Signed)
Called patient. No answer. Unable to leave msg to call back.

## 2017-07-24 NOTE — Telephone Encounter (Signed)
-----   Message from Arnoldo Lenis, MD sent at 07/20/2017  3:20 PM EDT ----- Regarding: RE: Result note  How often is she taking her lasix? It looks like Dr Raliegh Ip may have suspected that some of her swelling may have been related to weakened veins in her legs. Does she have compression stockings, if not please order a pair  J BrancH MD ----- Message ----- From: Levonne Hubert, LPN Sent: 9/49/4473   8:07 AM To: Arnoldo Lenis, MD Subject: Result note                                    Pt given test results and c/o continued SOB and swelling of the feet. Please advise.

## 2017-07-26 ENCOUNTER — Ambulatory Visit: Payer: Medicare Other | Admitting: Cardiovascular Disease

## 2017-07-28 ENCOUNTER — Ambulatory Visit (HOSPITAL_COMMUNITY)
Admission: RE | Admit: 2017-07-28 | Discharge: 2017-07-28 | Disposition: A | Discharge status: Home / Self Care | Payer: Medicare Other | Source: Ambulatory Visit | Attending: Pulmonary Disease | Admitting: Pulmonary Disease

## 2017-07-28 DIAGNOSIS — Z1231 Encounter for screening mammogram for malignant neoplasm of breast: Secondary | ICD-10-CM | POA: Diagnosis not present

## 2017-08-01 ENCOUNTER — Other Ambulatory Visit (HOSPITAL_COMMUNITY): Payer: Self-pay | Admitting: Pulmonary Disease

## 2017-08-01 DIAGNOSIS — R928 Other abnormal and inconclusive findings on diagnostic imaging of breast: Secondary | ICD-10-CM

## 2017-08-03 DIAGNOSIS — Z79899 Other long term (current) drug therapy: Secondary | ICD-10-CM | POA: Diagnosis not present

## 2017-08-04 ENCOUNTER — Telehealth: Payer: Self-pay

## 2017-08-04 LAB — BASIC METABOLIC PANEL
BUN: 15 mg/dL (ref 7–25)
CO2: 24 mmol/L (ref 20–32)
Calcium: 9.2 mg/dL (ref 8.6–10.4)
Chloride: 102 mmol/L (ref 98–110)
Creat: 1.03 mg/dL — ABNORMAL HIGH (ref 0.60–0.93)
Glucose, Bld: 148 mg/dL — ABNORMAL HIGH (ref 65–99)
POTASSIUM: 4.3 mmol/L (ref 3.5–5.3)
SODIUM: 138 mmol/L (ref 135–146)

## 2017-08-04 NOTE — Telephone Encounter (Signed)
-----   Message from Arnoldo Lenis, MD sent at 07/20/2017  3:20 PM EDT ----- Regarding: RE: Result note  How often is she taking her lasix? It looks like Dr Raliegh Ip may have suspected that some of her swelling may have been related to weakened veins in her legs. Does she have compression stockings, if not please order a pair  J BrancH MD ----- Message ----- From: Levonne Hubert, LPN Sent: 07/20/3663   8:07 AM To: Arnoldo Lenis, MD Subject: Result note                                    Pt given test results and c/o continued SOB and swelling of the feet. Please advise.

## 2017-08-04 NOTE — Telephone Encounter (Signed)
Called pt, no voicemail set up.

## 2017-08-05 ENCOUNTER — Emergency Department (HOSPITAL_COMMUNITY): Payer: Medicare Other

## 2017-08-05 ENCOUNTER — Encounter (HOSPITAL_COMMUNITY): Payer: Self-pay | Admitting: Emergency Medicine

## 2017-08-05 ENCOUNTER — Emergency Department (HOSPITAL_COMMUNITY)
Admission: EM | Admit: 2017-08-05 | Discharge: 2017-08-06 | Disposition: A | Discharge status: Home / Self Care | Payer: Medicare Other | Source: Home / Self Care | Attending: Emergency Medicine | Admitting: Emergency Medicine

## 2017-08-05 ENCOUNTER — Emergency Department (HOSPITAL_COMMUNITY)
Admission: EM | Admit: 2017-08-05 | Discharge: 2017-08-05 | Disposition: A | Discharge status: Home Health | Payer: Medicare Other | Attending: Emergency Medicine | Admitting: Emergency Medicine

## 2017-08-05 DIAGNOSIS — J181 Lobar pneumonia, unspecified organism: Principal | ICD-10-CM

## 2017-08-05 DIAGNOSIS — J189 Pneumonia, unspecified organism: Secondary | ICD-10-CM

## 2017-08-05 DIAGNOSIS — R51 Headache: Secondary | ICD-10-CM | POA: Diagnosis not present

## 2017-08-05 DIAGNOSIS — R5383 Other fatigue: Secondary | ICD-10-CM | POA: Diagnosis not present

## 2017-08-05 DIAGNOSIS — R112 Nausea with vomiting, unspecified: Secondary | ICD-10-CM | POA: Insufficient documentation

## 2017-08-05 DIAGNOSIS — R197 Diarrhea, unspecified: Secondary | ICD-10-CM | POA: Insufficient documentation

## 2017-08-05 DIAGNOSIS — E039 Hypothyroidism, unspecified: Secondary | ICD-10-CM | POA: Insufficient documentation

## 2017-08-05 DIAGNOSIS — R519 Headache, unspecified: Secondary | ICD-10-CM

## 2017-08-05 DIAGNOSIS — Z79899 Other long term (current) drug therapy: Secondary | ICD-10-CM

## 2017-08-05 DIAGNOSIS — R630 Anorexia: Secondary | ICD-10-CM | POA: Diagnosis not present

## 2017-08-05 DIAGNOSIS — R05 Cough: Secondary | ICD-10-CM | POA: Diagnosis not present

## 2017-08-05 LAB — BASIC METABOLIC PANEL
Anion gap: 8 (ref 5–15)
BUN: 19 mg/dL (ref 6–20)
CO2: 29 mmol/L (ref 22–32)
Calcium: 9.3 mg/dL (ref 8.9–10.3)
Chloride: 104 mmol/L (ref 101–111)
Creatinine, Ser: 1.06 mg/dL — ABNORMAL HIGH (ref 0.44–1.00)
GFR, EST NON AFRICAN AMERICAN: 52 mL/min — AB (ref >60)
Glucose, Bld: 137 mg/dL — ABNORMAL HIGH (ref 65–99)
POTASSIUM: 3.8 mmol/L (ref 3.5–5.1)
SODIUM: 141 mmol/L (ref 135–145)

## 2017-08-05 LAB — CBC WITH DIFFERENTIAL/PLATELET
BASOS ABS: 0 10*3/uL (ref 0.0–0.1)
BASOS PCT: 0 %
EOS ABS: 0 10*3/uL (ref 0.0–0.7)
EOS PCT: 0 %
HCT: 40.1 % (ref 36.0–46.0)
Hemoglobin: 13 g/dL (ref 12.0–15.0)
LYMPHS ABS: 0.5 10*3/uL — AB (ref 0.7–4.0)
Lymphocytes Relative: 5 %
MCH: 29 pg (ref 26.0–34.0)
MCHC: 32.4 g/dL (ref 30.0–36.0)
MCV: 89.5 fL (ref 78.0–100.0)
Monocytes Absolute: 0.7 10*3/uL (ref 0.1–1.0)
Monocytes Relative: 7 %
NEUTROS PCT: 88 %
Neutro Abs: 8.4 10*3/uL — ABNORMAL HIGH (ref 1.7–7.7)
PLATELETS: 225 10*3/uL (ref 150–400)
RBC: 4.48 MIL/uL (ref 3.87–5.11)
RDW: 14 % (ref 11.5–15.5)
WBC: 9.7 10*3/uL (ref 4.0–10.5)

## 2017-08-05 LAB — URINALYSIS, ROUTINE W REFLEX MICROSCOPIC
Bilirubin Urine: NEGATIVE
Glucose, UA: NEGATIVE mg/dL
HGB URINE DIPSTICK: NEGATIVE
Ketones, ur: 5 mg/dL — AB
Nitrite: NEGATIVE
Protein, ur: NEGATIVE mg/dL
SPECIFIC GRAVITY, URINE: 1.029 (ref 1.005–1.030)
pH: 5 (ref 5.0–8.0)

## 2017-08-05 LAB — TSH: TSH: 3.921 u[IU]/mL (ref 0.350–4.500)

## 2017-08-05 MED ORDER — LEVOFLOXACIN 500 MG PO TABS: 500.0000 mg | ORAL_TABLET | Freq: Every day | ORAL | 0 refills | Status: DC

## 2017-08-05 MED ORDER — HYDROMORPHONE HCL 1 MG/ML IJ SOLN
1.0000 mg | Freq: Once | INTRAMUSCULAR | Status: AC
  Administered 2017-08-05: 1 mg via INTRAVENOUS
  Filled 2017-08-05: qty 1

## 2017-08-05 MED ORDER — OXYCODONE-ACETAMINOPHEN 5-325 MG PO TABS: 1.0000 | ORAL_TABLET | Freq: Four times a day (QID) | ORAL | 0 refills | Status: DC | PRN

## 2017-08-05 MED ORDER — LORAZEPAM 2 MG/ML IJ SOLN
0.5000 mg | Freq: Once | INTRAMUSCULAR | Status: AC
  Administered 2017-08-05: 0.5 mg via INTRAVENOUS
  Filled 2017-08-05: qty 1

## 2017-08-05 MED ORDER — FENTANYL CITRATE (PF) 100 MCG/2ML IJ SOLN
50.0000 ug | INTRAMUSCULAR | Status: DC | PRN
  Administered 2017-08-05: 50 ug via INTRAVENOUS
  Filled 2017-08-05: qty 2

## 2017-08-05 MED ORDER — SODIUM CHLORIDE 0.9 % IV BOLUS (SEPSIS)
1000.0000 mL | Freq: Once | INTRAVENOUS | Status: AC
  Administered 2017-08-05: 1000 mL via INTRAVENOUS

## 2017-08-05 MED ORDER — DIPHENHYDRAMINE HCL 50 MG/ML IJ SOLN
25.0000 mg | Freq: Once | INTRAMUSCULAR | Status: AC
  Administered 2017-08-05: 25 mg via INTRAVENOUS
  Filled 2017-08-05: qty 1

## 2017-08-05 MED ORDER — ACETAMINOPHEN 325 MG PO TABS
650.0000 mg | ORAL_TABLET | Freq: Once | ORAL | Status: AC
  Administered 2017-08-05: 650 mg via ORAL
  Filled 2017-08-05: qty 2

## 2017-08-05 MED ORDER — DEXTROSE 5 % IV SOLN
1.0000 g | Freq: Once | INTRAVENOUS | Status: AC
  Administered 2017-08-05: 1 g via INTRAVENOUS
  Filled 2017-08-05: qty 10

## 2017-08-05 MED ORDER — METOCLOPRAMIDE HCL 5 MG/ML IJ SOLN
10.0000 mg | Freq: Once | INTRAMUSCULAR | Status: AC
  Administered 2017-08-05: 10 mg via INTRAVENOUS
  Filled 2017-08-05: qty 2

## 2017-08-05 MED ORDER — ONDANSETRON HCL 4 MG/2ML IJ SOLN
4.0000 mg | Freq: Once | INTRAMUSCULAR | Status: AC
  Administered 2017-08-05: 4 mg via INTRAVENOUS
  Filled 2017-08-05: qty 2

## 2017-08-05 MED ORDER — IBUPROFEN 800 MG PO TABS
800.0000 mg | ORAL_TABLET | Freq: Once | ORAL | Status: AC
  Administered 2017-08-05: 800 mg via ORAL
  Filled 2017-08-05: qty 1

## 2017-08-05 MED ORDER — KETOROLAC TROMETHAMINE 30 MG/ML IJ SOLN
INTRAMUSCULAR | Status: DC
Start: 2017-08-05 — End: 2017-08-05
  Filled 2017-08-05: qty 1

## 2017-08-05 MED ORDER — KETOROLAC TROMETHAMINE 30 MG/ML IJ SOLN
15.0000 mg | Freq: Once | INTRAMUSCULAR | Status: AC
  Administered 2017-08-05: 15 mg via INTRAVENOUS

## 2017-08-05 MED ORDER — LEVOFLOXACIN 500 MG PO TABS
500.0000 mg | ORAL_TABLET | Freq: Once | ORAL | Status: AC
  Administered 2017-08-05: 500 mg via ORAL
  Filled 2017-08-05: qty 1

## 2017-08-05 MED ORDER — ONDANSETRON 4 MG PO TBDP: ORAL_TABLET | ORAL | 0 refills | Status: DC

## 2017-08-05 NOTE — ED Provider Notes (Signed)
[facility] DEPT Provider Note   CSN: [id number] Arrival date & time: 08/05/17  0847     History   Chief Complaint Chief Complaint  Patient presents with  . Headache    HPI [name] is a 71 y.o. female.  Patient with history of hypothyroid compliant with medications, cholesterol, sleep apnea, reflux presents with recurrent vomiting diarrhea and headachesince Thursday. Gradual onset headache overall similar to one in the past. No neurologic symptoms. No new medications. No recent travel or antibiotics.      Past Medical History:  Diagnosis Date  . Diverticulosis   . Esophageal stricture   . Gastritis   . GERD (gastroesophageal reflux disease)   . Heart valve problem   . Hypercholesterolemia    can't take meds, affects LFTs  . Hypothyroidism   . OSA on CPAP     Patient Active Problem List   Diagnosis Date Noted  . Chest pain 04/26/2014  . Hypothyroidism 04/26/2014  . Headache 04/26/2014  . GERD (gastroesophageal reflux disease) 04/26/2014  . OSA on CPAP 04/26/2014  . LUQ pain 09/13/2011    Past Surgical History:  Procedure Laterality Date  . APPENDECTOMY    . CHOLECYSTECTOMY    . ESOPHAGOGASTRODUODENOSCOPY  09/28/11   mild gastritis/stricture in the distal esophagus/small gastric polyps benign  . THYROID SURGERY  2008   left  thyroid removed  . TOTAL ABDOMINAL HYSTERECTOMY    . TUBAL LIGATION    . YAG LASER APPLICATION Right 7/42/5956   Procedure: YAG LASER APPLICATION;  Surgeon: Rutherford Guys, MD;  Location: [facility];  Service: Ophthalmology;  Laterality: Right;  . YAG LASER APPLICATION Left 3/87/5643   Procedure: YAG LASER APPLICATION;  Surgeon: Rutherford Guys, MD;  Location: [facility];  Service: Ophthalmology;  Laterality: Left;    OB History    No data available       Home Medications    Prior to Admission medications   Medication Sig Start Date End Date Taking? Authorizing Provider  ALPRAZolam Duanne Moron) 0.5 MG tablet Take 0.5 mg by mouth at  bedtime as needed for anxiety.   Yes [provider]  cholecalciferol (VITAMIN D) 1000 units tablet Take 1,000 Units by mouth daily.   Yes [provider]  esomeprazole (NEXIUM) 40 MG capsule Take 40 mg by mouth daily as needed (acid reflux).    Yes [provider]  furosemide (LASIX) 40 MG tablet Take 40 mg by mouth daily as needed.   Yes [provider]  levothyroxine (SYNTHROID, LEVOTHROID) 75 MCG tablet Take 75 mcg by mouth daily before breakfast.   Yes [provider]  losartan (COZAAR) 25 MG tablet Take 1 tablet (25 mg total) by mouth daily. 07/04/17 10/02/17 Yes Herminio Commons, MD  naproxen sodium (ANAPROX) 220 MG tablet Take 440 mg by mouth daily as needed (pain).   Yes [provider]  simvastatin (ZOCOR) 20 MG tablet Take 20 mg by mouth daily.   Yes [provider]  ondansetron (ZOFRAN ODT) 4 MG disintegrating tablet 4mg  ODT q4 hours prn nausea/vomit 08/05/17   Elnora Morrison, MD    Family History Family History  Problem Relation Age of Onset  . Colon cancer Mother        in remission, diagnosed age 73  . Anesthesia problems Neg Hx   . Hypotension Neg Hx   . Malignant hyperthermia Neg Hx   . Pseudochol deficiency Neg Hx     Social History Social History  Substance Use Topics  .  Smoking status: Never Smoker  . Smokeless tobacco: Never Used  . Alcohol use No     Allergies   Patient has no known allergies.   Review of Systems Review of Systems  Constitutional: Positive for appetite change. Negative for chills and fever.  HENT: Negative for congestion.   Eyes: Negative for visual disturbance.  Respiratory: Negative for shortness of breath.   Cardiovascular: Negative for chest pain.  Gastrointestinal: Positive for diarrhea and vomiting. Negative for abdominal pain.  Genitourinary: Negative for dysuria and flank pain.  Musculoskeletal: Positive for arthralgias. Negative for back pain, neck pain and neck  stiffness.  Skin: Negative for rash.  Neurological: Positive for headaches. Negative for syncope, light-headedness and numbness.     Physical Exam Updated Vital Signs BP 122/73   Pulse 70   Temp 98.4 F (36.9 C) (Oral)   Resp 15   Ht 5\' 6"  (1.676 m)   Wt 81.6 kg (180 lb)   SpO2 93%   BMI 29.05 kg/m   Physical Exam  Constitutional: She is oriented to person, place, and time. She appears well-developed and well-nourished.  HENT:  Head: Normocephalic and atraumatic.  Eyes: Conjunctivae are normal. Right eye exhibits no discharge. Left eye exhibits no discharge.  Neck: Normal range of motion. Neck supple. No tracheal deviation present.  Cardiovascular: Normal rate and regular rhythm.   Pulmonary/Chest: Effort normal and breath sounds normal.  Abdominal: Soft. She exhibits no distension. There is no tenderness. There is no guarding.  Musculoskeletal: She exhibits no edema.  Neurological: She is alert and oriented to person, place, and time. GCS eye subscore is 4. GCS verbal subscore is 5. GCS motor subscore is 6.  5+ strength in UE and LE with f/e at major joints. Sensation to palpation intact in UE and LE. CNs 2-12 grossly intact.  EOMFI.  PERRL.   Finger nose and coordination intact bilateral.   Visual fields intact to finger testing. No nystagmus   Skin: Skin is warm. No rash noted.  Psychiatric: She has a normal mood and affect.  Nursing note and vitals reviewed.    ED Treatments / Results  Labs (all labs ordered are listed, but only abnormal results are displayed) Labs Reviewed  CBC WITH DIFFERENTIAL/PLATELET - Abnormal; Notable for the following:       Result Value   Neutro Abs 8.4 (*)    Lymphs Abs 0.5 (*)    All other components within normal limits  BASIC METABOLIC PANEL - Abnormal; Notable for the following:    Glucose, Bld 137 (*)    Creatinine, Ser 1.06 (*)    GFR calc non Af Amer 52 (*)    All other components within normal limits  URINALYSIS, ROUTINE  W REFLEX MICROSCOPIC - Abnormal; Notable for the following:    Color, Urine AMBER (*)    APPearance HAZY (*)    Ketones, ur 5 (*)    Leukocytes, UA MODERATE (*)    Bacteria, UA RARE (*)    Squamous Epithelial / LPF 0-5 (*)    All other components within normal limits  TSH    EKG  EKG Interpretation None       Radiology Ct Head Wo Contrast  Result Date: 08/05/2017 CLINICAL DATA:  Headache 3 days with photosensitivity. EXAM: CT HEAD WITHOUT CONTRAST TECHNIQUE: Contiguous axial images were obtained from the base of the skull through the vertex without intravenous contrast. COMPARISON:  04/26/2014 FINDINGS: Brain: Ventricles, cisterns and other CSF spaces are within normal. There  is no mass, mass effect, shift of midline structures or acute hemorrhage. No evidence of acute infarction. Vascular: No hyperdense vessel or unexpected calcification. Skull: Normal. Negative for fracture or focal lesion. Sinuses/Orbits: No acute finding. Other: None. IMPRESSION: No acute intracranial findings. Electronically Signed   By: Marin Olp M.D.   On: 08/05/2017 11:37    Procedures Procedures (including critical care time)  Medications Ordered in ED Medications  fentaNYL (SUBLIMAZE) injection 50 mcg (50 mcg Intravenous Given 08/05/17 1116)  ketorolac (TORADOL) 30 MG/ML injection (not administered)  sodium chloride 0.9 % bolus 1,000 mL (0 mLs Intravenous Stopped 08/05/17 1119)  metoCLOPramide (REGLAN) injection 10 mg (10 mg Intravenous Given 08/05/17 0930)  diphenhydrAMINE (BENADRYL) injection 25 mg (25 mg Intravenous Given 08/05/17 0930)  ketorolac (TORADOL) 30 MG/ML injection 15 mg (15 mg Intravenous Given 08/05/17 1116)     Initial Impression / Assessment and Plan / ED Course  I have reviewed the triage vital signs and the nursing notes.  Pertinent labs & imaging results that were available during my care of the patient were reviewed by me and considered in my medical decision making (see chart  for details).     Patient presents with nausea vomiting headache and fatigue. Plan for screening blood work urinalysis thyroid testing IV fluids and supportive care with medicines.  Patient did not have significant improvement. CT scan had ordered unremarkable. After second rounds of medicines patient significantly improving comfortable to outpatient follow-up. Results and differential diagnosis were discussed with the patient/parent/guardian. Xrays were independently reviewed by myself.  Close follow up outpatient was discussed, comfortable with the plan.   Medications  fentaNYL (SUBLIMAZE) injection 50 mcg (50 mcg Intravenous Given 08/05/17 1116)  ketorolac (TORADOL) 30 MG/ML injection (not administered)  sodium chloride 0.9 % bolus 1,000 mL (0 mLs Intravenous Stopped 08/05/17 1119)  metoCLOPramide (REGLAN) injection 10 mg (10 mg Intravenous Given 08/05/17 0930)  diphenhydrAMINE (BENADRYL) injection 25 mg (25 mg Intravenous Given 08/05/17 0930)  ketorolac (TORADOL) 30 MG/ML injection 15 mg (15 mg Intravenous Given 08/05/17 1116)    Vitals:   08/05/17 1106 08/05/17 1230 08/05/17 1233 08/05/17 1300  BP: 136/70 127/63 127/63 122/73  Pulse: 81 70 83 70  Resp: 16  15   Temp:      TempSrc:      SpO2: 95% 93% 98% 93%  Weight:      Height:        Final diagnoses:  Nausea vomiting and diarrhea  Headache, unspecified headache type     Final Clinical Impressions(s) / ED Diagnoses   Final diagnoses:  Nausea vomiting and diarrhea  Headache, unspecified headache type    New Prescriptions New Prescriptions   ONDANSETRON (ZOFRAN ODT) 4 MG DISINTEGRATING TABLET    4mg  ODT q4 hours prn nausea/vomit     Elnora Morrison, MD 08/05/17 1342

## 2017-08-05 NOTE — ED Provider Notes (Signed)
[facility] DEPT Provider Note   CSN: [id number] Arrival date & time: 08/05/17  2100     History   Chief Complaint Chief Complaint  Patient presents with  . Headache    HPI [name] is a 71 y.o. female.  Patient complains of a headache cough and congestion and fever.   The history is provided by the patient.  Cough  This is a new problem. The current episode started more than 2 days ago. The problem occurs every few minutes. The problem has not changed since onset.The cough is non-productive. The maximum temperature recorded prior to her arrival was 100 to 100.9 F. The fever has been present for less than 1 day. Associated symptoms include headaches. Pertinent negatives include no chest pain. She has tried nothing for the symptoms. The treatment provided no relief. She is not a smoker.    Past Medical History:  Diagnosis Date  . Diverticulosis   . Esophageal stricture   . Gastritis   . GERD (gastroesophageal reflux disease)   . Heart valve problem   . Hypercholesterolemia    can't take meds, affects LFTs  . Hypothyroidism   . OSA on CPAP     Patient Active Problem List   Diagnosis Date Noted  . Chest pain 04/26/2014  . Hypothyroidism 04/26/2014  . Headache 04/26/2014  . GERD (gastroesophageal reflux disease) 04/26/2014  . OSA on CPAP 04/26/2014  . LUQ pain 09/13/2011    Past Surgical History:  Procedure Laterality Date  . APPENDECTOMY    . CHOLECYSTECTOMY    . ESOPHAGOGASTRODUODENOSCOPY  09/28/11   mild gastritis/stricture in the distal esophagus/small gastric polyps benign  . THYROID SURGERY  2008   left  thyroid removed  . TOTAL ABDOMINAL HYSTERECTOMY    . TUBAL LIGATION    . YAG LASER APPLICATION Right 4/69/6295   Procedure: YAG LASER APPLICATION;  Surgeon: Rutherford Guys, MD;  Location: [facility];  Service: Ophthalmology;  Laterality: Right;  . YAG LASER APPLICATION Left 2/84/1324   Procedure: YAG LASER APPLICATION;  Surgeon: Rutherford Guys, MD;   Location: [facility];  Service: Ophthalmology;  Laterality: Left;    OB History    No data available       Home Medications    Prior to Admission medications   Medication Sig Start Date End Date Taking? Authorizing Provider  ALPRAZolam Duanne Moron) 0.5 MG tablet Take 0.5 mg by mouth at bedtime as needed for anxiety.    [provider]  cholecalciferol (VITAMIN D) 1000 units tablet Take 1,000 Units by mouth daily.    [provider]  esomeprazole (NEXIUM) 40 MG capsule Take 40 mg by mouth daily as needed (acid reflux).     [provider]  furosemide (LASIX) 40 MG tablet Take 40 mg by mouth daily as needed.    [provider]  levofloxacin (LEVAQUIN) 500 MG tablet Take 1 tablet (500 mg total) by mouth daily. 08/05/17   Milton Ferguson, MD  levothyroxine (SYNTHROID, LEVOTHROID) 75 MCG tablet Take 75 mcg by mouth daily before breakfast.    [provider]  losartan (COZAAR) 25 MG tablet Take 1 tablet (25 mg total) by mouth daily. 07/04/17 10/02/17  Herminio Commons, MD  naproxen sodium (ANAPROX) 220 MG tablet Take 440 mg by mouth daily as needed (pain).    [provider]  ondansetron (ZOFRAN ODT) 4 MG disintegrating tablet 4mg  ODT q4 hours prn nausea/vomit 08/05/17   Elnora Morrison, MD  oxyCODONE-acetaminophen (PERCOCET/ROXICET) 5-325 MG tablet Take  1 tablet by mouth every 6 (six) hours as needed. 08/05/17   Milton Ferguson, MD  simvastatin (ZOCOR) 20 MG tablet Take 20 mg by mouth daily.    [provider]    Family History Family History  Problem Relation Age of Onset  . Colon cancer Mother        in remission, diagnosed age 47  . Anesthesia problems Neg Hx   . Hypotension Neg Hx   . Malignant hyperthermia Neg Hx   . Pseudochol deficiency Neg Hx     Social History Social History  Substance Use Topics  . Smoking status: Never Smoker  . Smokeless tobacco: Never Used  . Alcohol use No     Allergies   Patient has no known  allergies.   Review of Systems Review of Systems  Constitutional: Negative for appetite change and fatigue.  HENT: Positive for congestion. Negative for ear discharge and sinus pressure.   Eyes: Negative for discharge.  Respiratory: Positive for cough.   Cardiovascular: Negative for chest pain.  Gastrointestinal: Negative for abdominal pain and diarrhea.  Genitourinary: Negative for frequency and hematuria.  Musculoskeletal: Negative for back pain.  Skin: Negative for rash.  Neurological: Positive for headaches. Negative for seizures.  Psychiatric/Behavioral: Negative for hallucinations.     Physical Exam Updated Vital Signs BP 140/63 (BP Location: Left Arm)   Pulse 93   Temp (!) 101.7 F (38.7 C) (Oral)   Resp 19   Ht 5\' 6"  (1.676 m)   Wt 81.6 kg (180 lb)   SpO2 93%   BMI 29.05 kg/m   Physical Exam  Constitutional: She is oriented to person, place, and time. She appears well-developed.  Patient appears nontoxic  HENT:  Head: Normocephalic.  Neck is supple  Eyes: Conjunctivae and EOM are normal. No scleral icterus.  Neck: Neck supple. No thyromegaly present.  Cardiovascular: Normal rate and regular rhythm.  Exam reveals no gallop and no friction rub.   No murmur heard. Pulmonary/Chest: No stridor. She has no wheezes. She has no rales. She exhibits no tenderness.  Abdominal: She exhibits no distension. There is no tenderness. There is no rebound.  Musculoskeletal: Normal range of motion. She exhibits no edema.  Lymphadenopathy:    She has no cervical adenopathy.  Neurological: She is oriented to person, place, and time. She exhibits normal muscle tone. Coordination normal.  Skin: No rash noted. No erythema.  Psychiatric: She has a normal mood and affect. Her behavior is normal.     ED Treatments / Results  Labs (all labs ordered are listed, but only abnormal results are displayed) Labs Reviewed  URINE CULTURE    EKG  EKG Interpretation None        Radiology Dg Chest 2 View  Result Date: 08/05/2017 CLINICAL DATA:  Acute onset of headache and fever. Cough and runny nose. Initial encounter. EXAM: CHEST  2 VIEW COMPARISON:  Chest radiograph performed 02/10/2016 FINDINGS: The lungs are well-aerated. Vascular congestion is noted. Bibasilar airspace opacities may reflect interstitial edema or possibly pneumonia. There is no evidence of pleural effusion or pneumothorax. The heart is mildly enlarged. No acute osseous abnormalities are seen. Clips are noted within the right upper quadrant, reflecting prior cholecystectomy. IMPRESSION: Vascular congestion and mild cardiomegaly. Bibasilar airspace opacities may reflect interstitial edema or possibly pneumonia. Electronically Signed   By: Garald Balding M.D.   On: 08/05/2017 23:03   Ct Head Wo Contrast  Result Date: 08/05/2017 CLINICAL DATA:  Headache 3 days with photosensitivity.  EXAM: CT HEAD WITHOUT CONTRAST TECHNIQUE: Contiguous axial images were obtained from the base of the skull through the vertex without intravenous contrast. COMPARISON:  04/26/2014 FINDINGS: Brain: Ventricles, cisterns and other CSF spaces are within normal. There is no mass, mass effect, shift of midline structures or acute hemorrhage. No evidence of acute infarction. Vascular: No hyperdense vessel or unexpected calcification. Skull: Normal. Negative for fracture or focal lesion. Sinuses/Orbits: No acute finding. Other: None. IMPRESSION: No acute intracranial findings. Electronically Signed   By: Marin Olp M.D.   On: 08/05/2017 11:37    Procedures Procedures (including critical care time)  Medications Ordered in ED Medications  ibuprofen (ADVIL,MOTRIN) tablet 800 mg (not administered)  levofloxacin (LEVAQUIN) tablet 500 mg (not administered)  sodium chloride 0.9 % bolus 1,000 mL (0 mLs Intravenous Stopped 08/05/17 2316)  acetaminophen (TYLENOL) tablet 650 mg (650 mg Oral Given 08/05/17 2211)  HYDROmorphone (DILAUDID)  injection 1 mg (1 mg Intravenous Given 08/05/17 2211)  ondansetron (ZOFRAN) injection 4 mg (4 mg Intravenous Given 08/05/17 2211)  cefTRIAXone (ROCEPHIN) 1 g in dextrose 5 % 50 mL IVPB (0 g Intravenous Stopped 08/05/17 2315)  HYDROmorphone (DILAUDID) injection 1 mg (1 mg Intravenous Given 08/05/17 2319)  LORazepam (ATIVAN) injection 0.5 mg (0.5 mg Intravenous Given 08/05/17 2319)     Initial Impression / Assessment and Plan / ED Course  I have reviewed the triage vital signs and the nursing notes.  Pertinent labs & imaging results that were available during my care of the patient were reviewed by me and considered in my medical decision making (see chart for details).     Patient was seen earlier today with normal CBC and chemistries. Her urinalysis did show 5-30 white cells and moderate leukocytes on the dip. We will do a urine culture on that. Chest x-ray shows possible pneumonia. Patient is not dyspneic or hypoxic. We will place her on Levaquin to cover for possible pneumonia or UTI. She is given pain medicines for her headache. Patient has a supple neck. Do not feel that she has meningitis.Patient will follow-up with her family doctor Dr. Luan Pulling this week  Final Clinical Impressions(s) / ED Diagnoses   Final diagnoses:  Community acquired pneumonia of right lower lobe of lung ([place])    New Prescriptions New Prescriptions   LEVOFLOXACIN (LEVAQUIN) 500 MG TABLET    Take 1 tablet (500 mg total) by mouth daily.   OXYCODONE-ACETAMINOPHEN (PERCOCET/ROXICET) 5-325 MG TABLET    Take 1 tablet by mouth every 6 (six) hours as needed.     Milton Ferguson, MD 08/05/17 [phone]

## 2017-08-05 NOTE — ED Triage Notes (Signed)
Pt reports headache since Thursday. Pt reports history of same "when my thyroid acts up." Pt also reports diarrhea,n/d. Pt reports light and sound sensitivity. nad noted.

## 2017-08-05 NOTE — ED Triage Notes (Signed)
D/c from ed this am for headache.  Headache is no better. Pt has a fever and reports coughing and running nose since Thursday.

## 2017-08-05 NOTE — Discharge Instructions (Signed)
Follow up with Dr. Luan Pulling this week.  Return here if you can not be seen in the office

## 2017-08-05 NOTE — Discharge Instructions (Signed)
Take zofran as needed for nausea  If you were given medicines take as directed.  If you are on coumadin or contraceptives realize their levels and effectiveness is altered by many different medicines.  If you have any reaction (rash, tongues swelling, other) to the medicines stop taking and see a physician.    If your blood pressure was elevated in the ER make sure you follow up for management with a primary doctor or return for chest pain, shortness of breath or stroke symptoms.  Please follow up as directed and return to the ER or see a physician for new or worsening symptoms.  Thank you. Vitals:   08/05/17 0901 08/05/17 0902 08/05/17 1106  BP: (!) 160/80  136/70  Pulse: 68  81  Resp: 18  16  Temp: 98.4 F (36.9 C)    TempSrc: Oral    SpO2: 96%  95%  Weight:  81.6 kg (180 lb)   Height:  5\' 6"  (1.676 m)

## 2017-08-07 LAB — URINE CULTURE

## 2017-08-08 ENCOUNTER — Encounter (HOSPITAL_COMMUNITY): Payer: Medicare Other

## 2017-08-14 ENCOUNTER — Ambulatory Visit (INDEPENDENT_AMBULATORY_CARE_PROVIDER_SITE_OTHER): Payer: Medicare Other | Admitting: Cardiovascular Disease

## 2017-08-14 ENCOUNTER — Encounter: Payer: Self-pay | Admitting: Cardiovascular Disease

## 2017-08-14 VITALS — BP 106/64 | HR 64 | Ht 66.0 in | Wt 192.0 lb

## 2017-08-14 DIAGNOSIS — Z79899 Other long term (current) drug therapy: Secondary | ICD-10-CM

## 2017-08-14 DIAGNOSIS — I10 Essential (primary) hypertension: Secondary | ICD-10-CM

## 2017-08-14 DIAGNOSIS — R0609 Other forms of dyspnea: Secondary | ICD-10-CM

## 2017-08-14 DIAGNOSIS — R079 Chest pain, unspecified: Secondary | ICD-10-CM | POA: Diagnosis not present

## 2017-08-14 DIAGNOSIS — R600 Localized edema: Secondary | ICD-10-CM

## 2017-08-14 MED ORDER — POTASSIUM CHLORIDE CRYS ER 20 MEQ PO TBCR: 20.0000 meq | EXTENDED_RELEASE_TABLET | Freq: Every day | ORAL | 0 refills | Status: DC

## 2017-08-14 NOTE — Progress Notes (Signed)
    SUBJECTIVE: The patient presents for routine follow up. Evaluated for headache in ED on 8/11 and prescribed Levaquin for possible pneumonia.  She underwent noninvasive testing which I ordered on 07/04/17 for chest pain and exertional dyspnea.  Nuclear stress test was low risk with soft tissue attenuation artifact vs a mild degree of ischemia.  Echocardiogram showed normal LV systolic function, LVEF 55-60%, grade 1 diastolic dysfunction, moderate LVH, and mild mitral and aortic regurgitation.  She said she has felt sick for the past 9 days. Chest x-ray performed in the ED showed vascular congestion with edema versus pneumonia. She has finished her antibiotics. She denies dysuria although urinalysis did show leukocytes and bacteria.  Relevant labs: White blood cells 9.7, hemoglobin 13, platelets 225, BUN 19, creatinine 1.06 on 08/05/17.  She is short of breath when walking from one room to the next. She denies exertional chest pain. She has a difficult time sleeping. She has not been able to see her PCP.    Review of Systems: As per "subjective", otherwise negative.  No Known Allergies  Current Outpatient Prescriptions  Medication Sig Dispense Refill  . ALPRAZolam (XANAX) 0.5 MG tablet Take 0.5 mg by mouth at bedtime as needed for anxiety.    . cholecalciferol (VITAMIN D) 1000 units tablet Take 1,000 Units by mouth daily.    . esomeprazole (NEXIUM) 40 MG capsule Take 40 mg by mouth daily as needed (acid reflux).     . furosemide (LASIX) 40 MG tablet Take 40 mg by mouth daily as needed.    . levothyroxine (SYNTHROID, LEVOTHROID) 75 MCG tablet Take 75 mcg by mouth daily before breakfast.    . losartan (COZAAR) 25 MG tablet Take 1 tablet (25 mg total) by mouth daily. 90 tablet 3  . simvastatin (ZOCOR) 20 MG tablet Take 20 mg by mouth daily.     No current facility-administered medications for this visit.     Past Medical History:  Diagnosis Date  . Diverticulosis   . Esophageal  stricture   . Gastritis   . GERD (gastroesophageal reflux disease)   . Heart valve problem   . Hypercholesterolemia    can't take meds, affects LFTs  . Hypothyroidism   . OSA on CPAP     Past Surgical History:  Procedure Laterality Date  . APPENDECTOMY    . CHOLECYSTECTOMY    . ESOPHAGOGASTRODUODENOSCOPY  09/28/11   mild gastritis/stricture in the distal esophagus/small gastric polyps benign  . THYROID SURGERY  2008   left  thyroid removed  . TOTAL ABDOMINAL HYSTERECTOMY    . TUBAL LIGATION    . YAG LASER APPLICATION Right 02/21/2017   Procedure: YAG LASER APPLICATION;  Surgeon: Mark Shapiro, MD;  Location: [facility];  Service: Ophthalmology;  Laterality: Right;  . YAG LASER APPLICATION Left 04/18/2017   Procedure: YAG LASER APPLICATION;  Surgeon: Mark Shapiro, MD;  Location: [facility];  Service: Ophthalmology;  Laterality: Left;    Social History   Social History  . Marital status: Widowed    Spouse name: N/A  . Number of children: 3  . Years of education: N/A   Occupational History  . retired     we[employer]    Social History Main Topics  . Smoking status: Never Smoker  . Smokeless tobacco: Never Used  . Alcohol use No  . Drug use: No  . Sexual activity: Not on file   Other Topics Concern  . Not on file   Social History Narrative  .   No narrative on file     Vitals:   08/14/17 1310  BP: 106/64  Pulse: 64  SpO2: 90%  Weight: 192 lb (87.1 kg)  Height: 5' 6" (1.676 m)    Wt Readings from Last 3 Encounters:  08/14/17 192 lb (87.1 kg)  08/05/17 180 lb (81.6 kg)  08/05/17 180 lb (81.6 kg)     PHYSICAL EXAM General: NAD HEENT: Normal. Neck: No JVD, no thyromegaly. Lungs: Clear to auscultation bilaterally with normal respiratory effort. CV: Nondisplaced PMI.  Regular rate and rhythm, normal S1/S2, no S3/S4, no murmur. No pretibial or periankle edema.    Abdomen: Soft, nontender, no distention.  Neurologic: Alert and oriented.  Psych: Normal affect. Skin:  Normal. Musculoskeletal: No Perfecto deformities.    ECG: Most recent ECG reviewed.   Labs: Lab Results  Component Value Date/Time   K 3.8 08/05/2017 09:31 AM   BUN 19 08/05/2017 09:31 AM   BUN 25 07/13/2017 08:40 AM   CREATININE 1.06 (H) 08/05/2017 09:31 AM   CREATININE 1.03 (H) 08/03/2017 08:25 AM   ALT 26 04/26/2014 12:20 PM   TSH 3.921 08/05/2017 09:31 AM   TSH 1.110 04/26/2014 12:20 PM   HGB 13.0 08/05/2017 09:31 AM     Lipids: Lab Results  Component Value Date/Time   LDLCALC 126 (H) 04/27/2014 06:16 AM   CHOL 215 (H) 04/27/2014 06:16 AM   TRIG 197 (H) 04/27/2014 06:16 AM   HDL 50 04/27/2014 06:16 AM       ASSESSMENT AND PLAN:  1. Chest pain: No signs of significant ischemic heart disease. Stress test was low risk and she has normal LV systolic function. No further CV testing is indicated at this time.  2. Hypertension: Blood pressure now normal after starting losartan 25 mg. No changes.  3. Bilateral leg and feet edema: Some of this may be related to venous insufficiency.  4. Exertional dyspnea: While there are no crackles appreciated on pulmonary exam today, she had a mild degree of vascular congestion on 08/05/17. I will attempt a trial of Lasix 40 mg daily for 3 days. I have asked her to call me back to let me know if symptoms are even marginally improved. If so, I may extend Lasix for a total of 5 days. I will also give her supplemental potassium and check a basic metabolic panel on 8/22. If symptoms do not improve, I would not rule out the possibility of right and left heart catheterization and coronary angiography.    Disposition: Follow up 2 weeks.   Suresh Koneswaran, M.D., F.A.C.C. 

## 2017-08-14 NOTE — Patient Instructions (Signed)
Medication Instructions:  TAKE LASIX 40 MG DAILY FOR NEXT 3 DAYS TAKE POTASSIUM 20 MEQ DAILY FOR NEXT THREE DAYS   Labwork: Wednesday   Testing/Procedures: NONE  Follow-Up: Your physician recommends that you schedule a follow-up appointment in: 2 WEEKS    Any Other Special Instructions Will Be Listed Below (If Applicable). PLEASE CALL us Wednesday AND LET us KNOW HOW YOU ARE FEELING.   If you need a refill on your cardiac medications before your next appointment, please call your pharmacy.

## 2017-08-15 ENCOUNTER — Ambulatory Visit (HOSPITAL_COMMUNITY)
Admission: RE | Admit: 2017-08-15 | Discharge: 2017-08-15 | Disposition: A | Discharge status: Home / Self Care | Payer: Medicare Other | Source: Ambulatory Visit | Attending: Pulmonary Disease | Admitting: Pulmonary Disease

## 2017-08-15 DIAGNOSIS — R928 Other abnormal and inconclusive findings on diagnostic imaging of breast: Secondary | ICD-10-CM | POA: Diagnosis not present

## 2017-08-16 ENCOUNTER — Telehealth: Payer: Self-pay | Admitting: Cardiovascular Disease

## 2017-08-16 DIAGNOSIS — Z79899 Other long term (current) drug therapy: Secondary | ICD-10-CM | POA: Diagnosis not present

## 2017-08-16 DIAGNOSIS — R079 Chest pain, unspecified: Secondary | ICD-10-CM

## 2017-08-16 LAB — BASIC METABOLIC PANEL
BUN: 27 mg/dL — AB (ref 7–25)
CHLORIDE: 98 mmol/L (ref 98–110)
CO2: 27 mmol/L (ref 20–32)
CREATININE: 1.19 mg/dL — AB (ref 0.60–0.93)
Calcium: 9.4 mg/dL (ref 8.6–10.4)
Glucose, Bld: 248 mg/dL — ABNORMAL HIGH (ref 65–99)
POTASSIUM: 5.1 mmol/L (ref 3.5–5.3)
Sodium: 136 mmol/L (ref 135–146)

## 2017-08-16 NOTE — Telephone Encounter (Signed)
Pt called stating she doesn't feel any different taking the fluid pills/ potassium for 3 days--had labs this morning.

## 2017-08-16 NOTE — Telephone Encounter (Signed)
Patient says she feels no better after taking the lasix for 3 days, has no energy, tired.Says she still feels SOB,does not have scale to weight self.i encouraged her to purchase scale     I will forward to Dr.Koneswaran

## 2017-08-17 ENCOUNTER — Other Ambulatory Visit: Payer: Self-pay | Admitting: Cardiovascular Disease

## 2017-08-17 ENCOUNTER — Encounter: Payer: Self-pay | Admitting: *Deleted

## 2017-08-17 DIAGNOSIS — R0609 Other forms of dyspnea: Principal | ICD-10-CM

## 2017-08-17 NOTE — Addendum Note (Signed)
Addended by: Levonne Hubert on: 08/17/2017 11:50 AM   Modules accepted: Orders

## 2017-08-17 NOTE — Telephone Encounter (Signed)
Cath is scheduled for Tues. 08/22/18 @ 12 with Dr. Tamala Julian

## 2017-08-17 NOTE — Telephone Encounter (Signed)
Arrange for right and left heart catheterization and coronary angiography.

## 2017-08-21 ENCOUNTER — Telehealth: Payer: Self-pay

## 2017-08-21 DIAGNOSIS — R079 Chest pain, unspecified: Secondary | ICD-10-CM | POA: Diagnosis not present

## 2017-08-21 LAB — CBC WITH DIFFERENTIAL/PLATELET
BASOS ABS: 55 {cells}/uL (ref 0–200)
BASOS PCT: 1 %
EOS PCT: 3 %
Eosinophils Absolute: 165 cells/uL (ref 15–500)
HCT: 38.9 % (ref 35.0–45.0)
Hemoglobin: 12.5 g/dL (ref 11.7–15.5)
Lymphocytes Relative: 24 %
Lymphs Abs: 1320 cells/uL (ref 850–3900)
MCH: 28.7 pg (ref 27.0–33.0)
MCHC: 32.1 g/dL (ref 32.0–36.0)
MCV: 89.4 fL (ref 80.0–100.0)
MONOS PCT: 6 %
MPV: 11.8 fL (ref 7.5–12.5)
Monocytes Absolute: 330 cells/uL (ref 200–950)
NEUTROS PCT: 66 %
Neutro Abs: 3630 cells/uL (ref 1500–7800)
PLATELETS: 325 10*3/uL (ref 140–400)
RBC: 4.35 MIL/uL (ref 3.80–5.10)
RDW: 13.8 % (ref 11.0–15.0)
WBC: 5.5 10*3/uL (ref 3.8–10.8)

## 2017-08-21 LAB — PROTIME-INR
INR: 1
PROTHROMBIN TIME: 10.4 s (ref 9.0–11.5)

## 2017-08-21 LAB — BASIC METABOLIC PANEL
BUN: 25 mg/dL (ref 7–25)
CO2: 28 mmol/L (ref 20–32)
Calcium: 8.9 mg/dL (ref 8.6–10.4)
Chloride: 102 mmol/L (ref 98–110)
Creat: 1.1 mg/dL — ABNORMAL HIGH (ref 0.60–0.93)
Glucose, Bld: 210 mg/dL — ABNORMAL HIGH (ref 65–99)
POTASSIUM: 4.7 mmol/L (ref 3.5–5.3)
SODIUM: 138 mmol/L (ref 135–146)

## 2017-08-21 NOTE — Telephone Encounter (Signed)
Attempted to call Pt.  Pt phone number states answering service has not been set up.  Call placed to fiance per DPR-message states number has been disconnected.   Unable to contact.

## 2017-08-22 ENCOUNTER — Ambulatory Visit (HOSPITAL_COMMUNITY)
Admission: RE | Admit: 2017-08-22 | Discharge: 2017-08-22 | Disposition: A | Discharge status: Home / Self Care | Payer: Medicare Other | Source: Ambulatory Visit | Attending: Interventional Cardiology | Admitting: Interventional Cardiology

## 2017-08-22 ENCOUNTER — Encounter (HOSPITAL_COMMUNITY)
Admission: RE | Disposition: A | Discharge status: Home / Self Care | Payer: Self-pay | Source: Ambulatory Visit | Attending: Interventional Cardiology

## 2017-08-22 DIAGNOSIS — K219 Gastro-esophageal reflux disease without esophagitis: Secondary | ICD-10-CM | POA: Diagnosis not present

## 2017-08-22 DIAGNOSIS — I5032 Chronic diastolic (congestive) heart failure: Secondary | ICD-10-CM | POA: Diagnosis not present

## 2017-08-22 DIAGNOSIS — E039 Hypothyroidism, unspecified: Secondary | ICD-10-CM | POA: Diagnosis not present

## 2017-08-22 DIAGNOSIS — I2510 Atherosclerotic heart disease of native coronary artery without angina pectoris: Secondary | ICD-10-CM | POA: Diagnosis not present

## 2017-08-22 DIAGNOSIS — Z9989 Dependence on other enabling machines and devices: Secondary | ICD-10-CM

## 2017-08-22 DIAGNOSIS — R079 Chest pain, unspecified: Secondary | ICD-10-CM | POA: Diagnosis present

## 2017-08-22 DIAGNOSIS — G4733 Obstructive sleep apnea (adult) (pediatric): Secondary | ICD-10-CM | POA: Diagnosis not present

## 2017-08-22 DIAGNOSIS — E7800 Pure hypercholesterolemia, unspecified: Secondary | ICD-10-CM | POA: Diagnosis not present

## 2017-08-22 DIAGNOSIS — I110 Hypertensive heart disease with heart failure: Secondary | ICD-10-CM | POA: Diagnosis not present

## 2017-08-22 DIAGNOSIS — R609 Edema, unspecified: Secondary | ICD-10-CM | POA: Diagnosis not present

## 2017-08-22 DIAGNOSIS — R0609 Other forms of dyspnea: Secondary | ICD-10-CM

## 2017-08-22 HISTORY — PX: LEFT HEART CATH AND CORONARY ANGIOGRAPHY: CATH118249

## 2017-08-22 SURGERY — LEFT HEART CATH AND CORONARY ANGIOGRAPHY
Anesthesia: LOCAL

## 2017-08-22 MED ORDER — HEPARIN (PORCINE) IN NACL 2-0.9 UNIT/ML-% IJ SOLN
INTRAMUSCULAR | Status: AC
  Filled 2017-08-22: qty 1000

## 2017-08-22 MED ORDER — SODIUM CHLORIDE 0.9% FLUSH: 3.0000 mL | Freq: Two times a day (BID) | INTRAVENOUS | Status: DC

## 2017-08-22 MED ORDER — MIDAZOLAM HCL 2 MG/2ML IJ SOLN
INTRAMUSCULAR | Status: AC
  Filled 2017-08-22: qty 2

## 2017-08-22 MED ORDER — ASPIRIN 81 MG PO CHEW
CHEWABLE_TABLET | ORAL | Status: AC
  Administered 2017-08-22: 81 mg via ORAL
  Filled 2017-08-22: qty 1

## 2017-08-22 MED ORDER — HEPARIN (PORCINE) IN NACL 2-0.9 UNIT/ML-% IJ SOLN
INTRAMUSCULAR | Status: DC | PRN
  Administered 2017-08-22: 12:00:00

## 2017-08-22 MED ORDER — SODIUM CHLORIDE 0.9% FLUSH: 3.0000 mL | INTRAVENOUS | Status: DC | PRN

## 2017-08-22 MED ORDER — LIDOCAINE HCL (PF) 1 % IJ SOLN
INTRAMUSCULAR | Status: DC | PRN
  Administered 2017-08-22 (×2): 2 mL
  Administered 2017-08-22: 3 mL

## 2017-08-22 MED ORDER — IOPAMIDOL (ISOVUE-370) INJECTION 76%
INTRAVENOUS | Status: DC | PRN
  Administered 2017-08-22: 55 mL via INTRAVENOUS

## 2017-08-22 MED ORDER — MIDAZOLAM HCL 2 MG/2ML IJ SOLN
INTRAMUSCULAR | Status: DC | PRN
  Administered 2017-08-22 (×3): 1 mg via INTRAVENOUS

## 2017-08-22 MED ORDER — SODIUM CHLORIDE 0.9 % IV SOLN: INTRAVENOUS | Status: AC

## 2017-08-22 MED ORDER — SODIUM CHLORIDE 0.9 % IV SOLN: 250.0000 mL | INTRAVENOUS | Status: DC | PRN

## 2017-08-22 MED ORDER — VERAPAMIL HCL 2.5 MG/ML IV SOLN
INTRAVENOUS | Status: DC | PRN
  Administered 2017-08-22: 11:00:00 via INTRA_ARTERIAL

## 2017-08-22 MED ORDER — SODIUM CHLORIDE 0.9 % WEIGHT BASED INFUSION: 1.0000 mL/kg/h | INTRAVENOUS | Status: DC

## 2017-08-22 MED ORDER — SODIUM CHLORIDE 0.9 % WEIGHT BASED INFUSION
3.0000 mL/kg/h | INTRAVENOUS | Status: AC
  Administered 2017-08-22: 3 mL/kg/h via INTRAVENOUS

## 2017-08-22 MED ORDER — FENTANYL CITRATE (PF) 100 MCG/2ML IJ SOLN
INTRAMUSCULAR | Status: DC | PRN
  Administered 2017-08-22 (×2): 50 ug via INTRAVENOUS

## 2017-08-22 MED ORDER — LIDOCAINE HCL (PF) 1 % IJ SOLN
INTRAMUSCULAR | Status: AC
  Filled 2017-08-22: qty 30

## 2017-08-22 MED ORDER — FENTANYL CITRATE (PF) 100 MCG/2ML IJ SOLN
INTRAMUSCULAR | Status: AC
  Filled 2017-08-22: qty 2

## 2017-08-22 MED ORDER — HEPARIN SODIUM (PORCINE) 1000 UNIT/ML IJ SOLN
INTRAMUSCULAR | Status: DC | PRN
  Administered 2017-08-22: 4000 [IU] via INTRAVENOUS

## 2017-08-22 MED ORDER — IOPAMIDOL (ISOVUE-370) INJECTION 76%
INTRAVENOUS | Status: AC
  Filled 2017-08-22: qty 100

## 2017-08-22 MED ORDER — HEPARIN SODIUM (PORCINE) 1000 UNIT/ML IJ SOLN
INTRAMUSCULAR | Status: AC
  Filled 2017-08-22: qty 1

## 2017-08-22 MED ORDER — ASPIRIN 81 MG PO CHEW
81.0000 mg | CHEWABLE_TABLET | ORAL | Status: AC
  Administered 2017-08-22: 81 mg via ORAL

## 2017-08-22 MED ORDER — ACETAMINOPHEN 325 MG PO TABS: 650.0000 mg | ORAL_TABLET | ORAL | Status: DC | PRN

## 2017-08-22 MED ORDER — VERAPAMIL HCL 2.5 MG/ML IV SOLN
INTRAVENOUS | Status: AC
  Filled 2017-08-22: qty 2

## 2017-08-22 MED ORDER — ONDANSETRON HCL 4 MG/2ML IJ SOLN: 4.0000 mg | Freq: Four times a day (QID) | INTRAMUSCULAR | Status: DC | PRN

## 2017-08-22 SURGICAL SUPPLY — 15 items
CATH EXPO 5F FL3.5 (CATHETERS) ×1 IMPLANT
CATH INFINITI JR4 5F (CATHETERS) ×1 IMPLANT
COVER PRB 48X5XTLSCP FOLD TPE (BAG) IMPLANT
COVER PROBE 5X48 (BAG) ×2
DEVICE RAD COMP TR BAND LRG (VASCULAR PRODUCTS) ×1 IMPLANT
GLIDESHEATH SLEND A-KIT 6F 22G (SHEATH) ×1 IMPLANT
GUIDEWIRE INQWIRE 1.5J.035X260 (WIRE) IMPLANT
INQWIRE 1.5J .035X260CM (WIRE) ×2
KIT HEART LEFT (KITS) ×2 IMPLANT
PACK CARDIAC CATHETERIZATION (CUSTOM PROCEDURE TRAY) ×2 IMPLANT
SHEATH GLIDE SLENDER 4/5FR (SHEATH) ×1 IMPLANT
SHEATH PINNACLE 7F 10CM (SHEATH) IMPLANT
TRANSDUCER W/STOPCOCK (MISCELLANEOUS) ×2 IMPLANT
TUBING CIL FLEX 10 FLL-RA (TUBING) ×2 IMPLANT
WIRE HI TORQ VERSACORE-J 145CM (WIRE) ×1 IMPLANT

## 2017-08-22 NOTE — Interval H&P Note (Signed)
Cath Lab Visit (complete for each Cath Lab visit)  Clinical Evaluation Leading to the Procedure:   ACS: No.  Non-ACS:    Anginal Classification: CCS Morris  Anti-ischemic medical therapy: Minimal Therapy (1 class of medications)  Non-Invasive Test Results: Low-risk stress test findings: cardiac mortality <1%/year  Prior CABG: No previous CABG      History and Physical Interval Note:  08/22/2017 10:28 AM  [name]  has presented today for surgery, with the diagnosis of doe   cp  The various methods of treatment have been discussed with the patient and family. After consideration of risks, benefits and other options for treatment, the patient has consented to  Procedure(s): RIGHT/LEFT HEART CATH AND CORONARY ANGIOGRAPHY (N/A) as a surgical intervention .  The patient's history has been reviewed, patient examined, no change in status, stable for surgery.  I have reviewed the patient's chart and labs.  Questions were answered to the patient's satisfaction.     [name]

## 2017-08-22 NOTE — Discharge Instructions (Signed)

## 2017-08-22 NOTE — H&P (View-Only) (Signed)
SUBJECTIVE: The patient presents for routine follow up. Evaluated for headache in ED on 8/11 and prescribed Levaquin for possible pneumonia.  She underwent noninvasive testing which I ordered on 07/04/17 for chest pain and exertional dyspnea.  Nuclear stress test was low risk with soft tissue attenuation artifact vs a mild degree of ischemia.  Echocardiogram showed normal LV systolic function, LVEF 27-06%, grade 1 diastolic dysfunction, moderate LVH, and mild mitral and aortic regurgitation.  She said she has felt sick for the past 9 days. Chest x-ray performed in the ED showed vascular congestion with edema versus pneumonia. She has finished her antibiotics. She denies dysuria although urinalysis did show leukocytes and bacteria.  Relevant labs: White blood cells 9.7, hemoglobin 13, platelets 225, BUN 19, creatinine 1.06 on 08/05/17.  She is short of breath when walking from one room to the next. She denies exertional chest pain. She has a difficult time sleeping. She has not been able to see her PCP.    Review of Systems: As per "subjective", otherwise negative.  No Known Allergies  Current Outpatient Prescriptions  Medication Sig Dispense Refill  . ALPRAZolam (XANAX) 0.5 MG tablet Take 0.5 mg by mouth at bedtime as needed for anxiety.    . cholecalciferol (VITAMIN D) 1000 units tablet Take 1,000 Units by mouth daily.    Marland Kitchen esomeprazole (NEXIUM) 40 MG capsule Take 40 mg by mouth daily as needed (acid reflux).     . furosemide (LASIX) 40 MG tablet Take 40 mg by mouth daily as needed.    Marland Kitchen levothyroxine (SYNTHROID, LEVOTHROID) 75 MCG tablet Take 75 mcg by mouth daily before breakfast.    . losartan (COZAAR) 25 MG tablet Take 1 tablet (25 mg total) by mouth daily. 90 tablet 3  . simvastatin (ZOCOR) 20 MG tablet Take 20 mg by mouth daily.     No current facility-administered medications for this visit.     Past Medical History:  Diagnosis Date  . Diverticulosis   . Esophageal  stricture   . Gastritis   . GERD (gastroesophageal reflux disease)   . Heart valve problem   . Hypercholesterolemia    can't take meds, affects LFTs  . Hypothyroidism   . OSA on CPAP     Past Surgical History:  Procedure Laterality Date  . APPENDECTOMY    . CHOLECYSTECTOMY    . ESOPHAGOGASTRODUODENOSCOPY  09/28/11   mild gastritis/stricture in the distal esophagus/small gastric polyps benign  . THYROID SURGERY  2008   left  thyroid removed  . TOTAL ABDOMINAL HYSTERECTOMY    . TUBAL LIGATION    . YAG LASER APPLICATION Right 2/37/6283   Procedure: YAG LASER APPLICATION;  Surgeon: Rutherford Guys, MD;  Location: [facility];  Service: Ophthalmology;  Laterality: Right;  . YAG LASER APPLICATION Left 1/51/7616   Procedure: YAG LASER APPLICATION;  Surgeon: Rutherford Guys, MD;  Location: [facility];  Service: Ophthalmology;  Laterality: Left;    Social History   Social History  . Marital status: Widowed    Spouse name: N/A  . Number of children: 3  . Years of education: N/A   Occupational History  . retired     Psychologist, prison and probation services    Social History Main Topics  . Smoking status: Never Smoker  . Smokeless tobacco: Never Used  . Alcohol use No  . Drug use: No  . Sexual activity: Not on file   Other Topics Concern  . Not on file   Social History Narrative  .  No narrative on file     Vitals:   08/14/17 1310  BP: 106/64  Pulse: 64  SpO2: 90%  Weight: 192 lb (87.1 kg)  Height: 5\' 6"  (1.676 m)    Wt Readings from Last 3 Encounters:  08/14/17 192 lb (87.1 kg)  08/05/17 180 lb (81.6 kg)  08/05/17 180 lb (81.6 kg)     PHYSICAL EXAM General: NAD HEENT: Normal. Neck: No JVD, no thyromegaly. Lungs: Clear to auscultation bilaterally with normal respiratory effort. CV: Nondisplaced PMI.  Regular rate and rhythm, normal S1/S2, no S3/S4, no murmur. No pretibial or periankle edema.    Abdomen: Soft, nontender, no distention.  Neurologic: Alert and oriented.  Psych: Normal affect. Skin:  Normal. Musculoskeletal: No Hird deformities.    ECG: Most recent ECG reviewed.   Labs: Lab Results  Component Value Date/Time   K 3.8 08/05/2017 09:31 AM   BUN 19 08/05/2017 09:31 AM   BUN 25 07/13/2017 08:40 AM   CREATININE 1.06 (H) 08/05/2017 09:31 AM   CREATININE 1.03 (H) 08/03/2017 08:25 AM   ALT 26 04/26/2014 12:20 PM   TSH 3.921 08/05/2017 09:31 AM   TSH 1.110 04/26/2014 12:20 PM   HGB 13.0 08/05/2017 09:31 AM     Lipids: Lab Results  Component Value Date/Time   LDLCALC 126 (H) 04/27/2014 06:16 AM   CHOL 215 (H) 04/27/2014 06:16 AM   TRIG 197 (H) 04/27/2014 06:16 AM   HDL 50 04/27/2014 06:16 AM       ASSESSMENT AND PLAN:  1. Chest pain: No signs of significant ischemic heart disease. Stress test was low risk and she has normal LV systolic function. No further CV testing is indicated at this time.  2. Hypertension: Blood pressure now normal after starting losartan 25 mg. No changes.  3. Bilateral leg and feet edema: Some of this may be related to venous insufficiency.  4. Exertional dyspnea: While there are no crackles appreciated on pulmonary exam today, she had a mild degree of vascular congestion on 08/05/17. I will attempt a trial of Lasix 40 mg daily for 3 days. I have asked her to call me back to let me know if symptoms are even marginally improved. If so, I may extend Lasix for a total of 5 days. I will also give her supplemental potassium and check a basic metabolic panel on 8/08. If symptoms do not improve, I would not rule out the possibility of right and left heart catheterization and coronary angiography.    Disposition: Follow up 2 weeks.   Kate Sable, M.D., F.A.C.C.

## 2017-08-23 ENCOUNTER — Encounter (HOSPITAL_COMMUNITY): Payer: Self-pay | Admitting: Interventional Cardiology

## 2017-09-01 ENCOUNTER — Encounter: Payer: Self-pay | Admitting: Adult Health

## 2017-09-01 ENCOUNTER — Ambulatory Visit (INDEPENDENT_AMBULATORY_CARE_PROVIDER_SITE_OTHER): Payer: Medicare Other | Admitting: Adult Health

## 2017-09-01 VITALS — BP 126/78 | HR 92 | Ht 66.0 in | Wt 194.0 lb

## 2017-09-01 DIAGNOSIS — I5032 Chronic diastolic (congestive) heart failure: Secondary | ICD-10-CM

## 2017-09-01 DIAGNOSIS — I10 Essential (primary) hypertension: Secondary | ICD-10-CM

## 2017-09-01 DIAGNOSIS — E7800 Pure hypercholesterolemia, unspecified: Secondary | ICD-10-CM

## 2017-09-01 DIAGNOSIS — R0609 Other forms of dyspnea: Secondary | ICD-10-CM

## 2017-09-01 NOTE — Progress Notes (Signed)
Cardiology Office Note   Date:  09/01/2017   ID:  Mariyah, Upshaw 1946/02/01, MRN [id number]  PCP:  Sinda Du, MD  Cardiologist: Dickie La chief complaint on file.     History of Present Illness: JULIEN BERRYMAN is a 71 y.o. female who presents for ongoing assessment and management of chest pain with recent cardiac catheterization completed on 08/22/2017 by Dr. Daneen Schick. This revealed normal coronary arteries with right dominant anatomy. They were unable to the right heart catheterization due to inability to assess the venous system from the upper extremities. The patient refused femoral approach. Cardiac catheterization was completed in the setting of worsening dyspnea on exertion  prior to catheterization the patient was seen in the office and Lasix was increased to 40 mg daily for 3 days to assist with fluid retention breathing status, along with bilateral lower extremity edema.  Cardiac Cath 08/22/2017   Normal coronary arteries with right dominant anatomy.  Normal left ventricular systolic function with mild elevation in end-diastolic pressure compatible with chronic diastolic heart failure.  Failed attempts at right heart catheterization due to inability to access the venous system from either upper extremity. Patient refused femoral approach.   She comes today feeling about the same. Her breathing status has not changed. She states she is unable to use CPAP as she is so claustrophobic. She is working with Dr. Luan Pulling her primary care/pulmonologist for ongoing options. She denies recurrent chest pain, but is chronically fatigued. She states she does not sleep at night due to her breathing status. She wakes up several times during the night.  Past Medical History:  Diagnosis Date  . Diverticulosis   . Esophageal stricture   . Gastritis   . GERD (gastroesophageal reflux disease)   . Heart valve problem   . Hypercholesterolemia    can't take meds, affects LFTs  .  Hypothyroidism   . OSA on CPAP     Past Surgical History:  Procedure Laterality Date  . APPENDECTOMY    . CHOLECYSTECTOMY    . ESOPHAGOGASTRODUODENOSCOPY  09/28/11   mild gastritis/stricture in the distal esophagus/small gastric polyps benign  . LEFT HEART CATH AND CORONARY ANGIOGRAPHY N/A 08/22/2017   Procedure: LEFT HEART CATH AND CORONARY ANGIOGRAPHY;  Surgeon: Belva Crome, MD;  Location: [facility];  Service: Cardiovascular;  Laterality: N/A;  . THYROID SURGERY  2008   left  thyroid removed  . TOTAL ABDOMINAL HYSTERECTOMY    . TUBAL LIGATION    . YAG LASER APPLICATION Right 08/26/5175   Procedure: YAG LASER APPLICATION;  Surgeon: Rutherford Guys, MD;  Location: [facility];  Service: Ophthalmology;  Laterality: Right;  . YAG LASER APPLICATION Left 1/60/7371   Procedure: YAG LASER APPLICATION;  Surgeon: Rutherford Guys, MD;  Location: [facility];  Service: Ophthalmology;  Laterality: Left;     Current Outpatient Prescriptions  Medication Sig Dispense Refill  . acetaminophen (TYLENOL) 500 MG tablet Take 1,000 mg by mouth every 6 (six) hours as needed (for pain.).    Marland Kitchen ALPRAZolam (XANAX) 0.5 MG tablet Take 0.5 mg by mouth at bedtime as needed for anxiety.    . cholecalciferol (VITAMIN D) 1000 units tablet Take 1,000 Units by mouth daily.    Marland Kitchen esomeprazole (NEXIUM) 40 MG capsule Take 40 mg by mouth daily as needed (acid reflux).     . furosemide (LASIX) 40 MG tablet Take 40 mg by mouth daily.     Marland Kitchen levothyroxine (SYNTHROID, LEVOTHROID) 75 MCG tablet Take  75 mcg by mouth daily before breakfast.    . losartan (COZAAR) 25 MG tablet Take 1 tablet (25 mg total) by mouth daily. 90 tablet 3  . potassium chloride SA (K-DUR,KLOR-CON) 20 MEQ tablet Take 1 tablet (20 mEq total) by mouth daily. 30 tablet 0  . simvastatin (ZOCOR) 20 MG tablet Take 20 mg by mouth daily.     No current facility-administered medications for this visit.     Allergies:   Patient has no known allergies.    Social  History:  The patient  reports that she has never smoked. She has never used smokeless tobacco. She reports that she does not drink alcohol or use drugs.   Family History:  The patient's family history includes Colon cancer in her mother.    ROS: All other systems are reviewed and negative. Unless otherwise mentioned in H&P    PHYSICAL EXAM: VS:  BP 126/78   Pulse 92   Ht 5\' 6"  (1.676 m)   Wt 194 lb (88 kg)   SpO2 95%   BMI 31.31 kg/m  , BMI Body mass index is 31.31 kg/m. GEN: Well nourished, well developed, in no acute distress  HEENT: normal  Neck: no JVD, carotid bruits, or masses Cardiac: RRR; no murmurs, rubs, or gallops,no edema  Respiratory:  Scant inspiratory crackles, cleared with cough GI: soft, nontender, nondistended, + BS MS: no deformity or atrophy  Skin: warm and dry, no rash Neuro:  Strength and sensation are intact Psych: euthymic mood, full affect   Recent Labs: 08/05/2017: TSH 3.921 08/21/2017: BUN 25; Creat 1.10; Hemoglobin 12.5; Platelets 325; Potassium 4.7; Sodium 138    Lipid Panel    Component Value Date/Time   CHOL 215 (H) 04/27/2014 0616   TRIG 197 (H) 04/27/2014 0616   HDL 50 04/27/2014 0616   CHOLHDL 4.3 04/27/2014 0616   VLDL 39 04/27/2014 0616   LDLCALC 126 (H) 04/27/2014 0616      Wt Readings from Last 3 Encounters:  09/01/17 194 lb (88 kg)  08/22/17 185 lb (83.9 kg)  08/14/17 192 lb (87.1 kg)     ASSESSMENT AND PLAN:  1. Chronic dyspnea: The patient has undergone a cardiac catheterization was normal coronary arteries. They were unable to perform a right heart catheterization as she refused left groin insertion. She will continue with her primary pulmonologist and physician, Dr. Luan Pulling for ongoing treatment of OSA. No further cardiac testing is planned.  2. Hypercholesterolemia: Continue statin therapy  3. Chronic diastolic CHF: Continue Lasix as directed.  4. Hypertension: Excellent control of blood pressure. She remains on  losartan 25 mg daily   Current medicines are reviewed at length with the patient today.    Labs/ tests ordered today include:  Phill Myron. West Pugh, ANP, AACC   09/01/2017 2:12 PM    [facility] [address] [place], [place] [address] Phone: [phone]; Fax: [phone]

## 2017-09-01 NOTE — Patient Instructions (Signed)
Medication Instructions:  Your physician recommends that you continue on your current medications as directed. Please refer to the Current Medication list given to you today.   Labwork: NONE   Testing/Procedures: NONE   Follow-Up: Your physician wants you to follow-up in: 6 Months. You will receive a reminder letter in the mail two months in advance. If you don't receive a letter, please call our office to schedule the follow-up appointment.   Any Other Special Instructions Will Be Listed Below (If Applicable).     If you need a refill on your cardiac medications before your next appointment, please call your pharmacy.  Thank you for choosing [facility]!   

## 2017-10-02 DIAGNOSIS — F321 Major depressive disorder, single episode, moderate: Secondary | ICD-10-CM | POA: Diagnosis not present

## 2017-10-02 DIAGNOSIS — G4733 Obstructive sleep apnea (adult) (pediatric): Secondary | ICD-10-CM | POA: Diagnosis not present

## 2017-10-02 DIAGNOSIS — J209 Acute bronchitis, unspecified: Secondary | ICD-10-CM | POA: Diagnosis not present

## 2017-10-02 DIAGNOSIS — E119 Type 2 diabetes mellitus without complications: Secondary | ICD-10-CM | POA: Diagnosis not present

## 2017-10-05 ENCOUNTER — Ambulatory Visit: Payer: Medicare Other | Admitting: Family Medicine

## 2017-10-13 ENCOUNTER — Ambulatory Visit: Payer: Medicare Other | Admitting: Family Medicine

## 2017-10-14 DIAGNOSIS — B379 Candidiasis, unspecified: Secondary | ICD-10-CM | POA: Diagnosis not present

## 2017-10-14 DIAGNOSIS — Z0000 Encounter for general adult medical examination without abnormal findings: Secondary | ICD-10-CM | POA: Diagnosis not present

## 2017-10-14 DIAGNOSIS — R7301 Impaired fasting glucose: Secondary | ICD-10-CM | POA: Diagnosis not present

## 2017-10-14 DIAGNOSIS — R05 Cough: Secondary | ICD-10-CM | POA: Diagnosis not present

## 2017-10-14 DIAGNOSIS — R358 Other polyuria: Secondary | ICD-10-CM | POA: Diagnosis not present

## 2017-10-15 ENCOUNTER — Encounter (HOSPITAL_COMMUNITY): Payer: Self-pay | Admitting: Emergency Medicine

## 2017-10-15 ENCOUNTER — Emergency Department (HOSPITAL_COMMUNITY)
Admission: EM | Admit: 2017-10-15 | Discharge: 2017-10-15 | Disposition: A | Discharge status: Home / Self Care | Payer: Medicare Other | Attending: Emergency Medicine | Admitting: Emergency Medicine

## 2017-10-15 DIAGNOSIS — R739 Hyperglycemia, unspecified: Secondary | ICD-10-CM | POA: Diagnosis not present

## 2017-10-15 DIAGNOSIS — E039 Hypothyroidism, unspecified: Secondary | ICD-10-CM | POA: Insufficient documentation

## 2017-10-15 DIAGNOSIS — I5032 Chronic diastolic (congestive) heart failure: Secondary | ICD-10-CM | POA: Diagnosis not present

## 2017-10-15 DIAGNOSIS — Z7984 Long term (current) use of oral hypoglycemic drugs: Secondary | ICD-10-CM | POA: Diagnosis not present

## 2017-10-15 DIAGNOSIS — Z79899 Other long term (current) drug therapy: Secondary | ICD-10-CM | POA: Insufficient documentation

## 2017-10-15 LAB — URINALYSIS, ROUTINE W REFLEX MICROSCOPIC
BILIRUBIN URINE: NEGATIVE
Glucose, UA: >=500 mg/dL — AB
Hgb urine dipstick: NEGATIVE
Ketones, ur: 20 mg/dL — AB
Nitrite: NEGATIVE
Protein, ur: NEGATIVE mg/dL
SPECIFIC GRAVITY, URINE: 1.042 — AB (ref 1.005–1.030)
pH: 5 (ref 5.0–8.0)

## 2017-10-15 LAB — BLOOD GAS, VENOUS
ACID-BASE DEFICIT: 0.6 mmol/L (ref 0.0–2.0)
Bicarbonate: 23.9 mmol/L (ref 20.0–28.0)
FIO2: 21
O2 Saturation: 89.9 %
PATIENT TEMPERATURE: 37
PH VEN: 7.407 (ref 7.250–7.430)
pCO2, Ven: 38 mmHg — ABNORMAL LOW (ref 44.0–60.0)
pO2, Ven: 58.6 mmHg — ABNORMAL HIGH (ref 32.0–45.0)

## 2017-10-15 LAB — CBC
HEMATOCRIT: 42.5 % (ref 36.0–46.0)
Hemoglobin: 14 g/dL (ref 12.0–15.0)
MCH: 29.1 pg (ref 26.0–34.0)
MCHC: 32.9 g/dL (ref 30.0–36.0)
MCV: 88.4 fL (ref 78.0–100.0)
Platelets: 240 10*3/uL (ref 150–400)
RBC: 4.81 MIL/uL (ref 3.87–5.11)
RDW: 13.8 % (ref 11.5–15.5)
WBC: 11 10*3/uL — ABNORMAL HIGH (ref 4.0–10.5)

## 2017-10-15 LAB — BASIC METABOLIC PANEL
Anion gap: 11 (ref 5–15)
BUN: 21 mg/dL — AB (ref 6–20)
CHLORIDE: 99 mmol/L — AB (ref 101–111)
CO2: 26 mmol/L (ref 22–32)
Calcium: 9.2 mg/dL (ref 8.9–10.3)
Creatinine, Ser: 1.12 mg/dL — ABNORMAL HIGH (ref 0.44–1.00)
GFR calc Af Amer: 56 mL/min — ABNORMAL LOW (ref >60)
GFR calc non Af Amer: 49 mL/min — ABNORMAL LOW (ref >60)
GLUCOSE: 406 mg/dL — AB (ref 65–99)
POTASSIUM: 3.8 mmol/L (ref 3.5–5.1)
Sodium: 136 mmol/L (ref 135–145)

## 2017-10-15 LAB — CBG MONITORING, ED: GLUCOSE-CAPILLARY: 401 mg/dL — AB (ref 65–99)

## 2017-10-15 MED ORDER — SODIUM CHLORIDE 0.9 % IV BOLUS (SEPSIS)
1000.0000 mL | Freq: Once | INTRAVENOUS | Status: AC
  Administered 2017-10-15: 1000 mL via INTRAVENOUS

## 2017-10-15 NOTE — Discharge Instructions (Signed)
As discussed, your evaluation today has been largely reassuring.  But, it is important that you monitor your condition carefully, and do not hesitate to return to the ED if you develop new, or concerning changes in your condition. ? ?Otherwise, please follow-up with your physician for appropriate ongoing care. ? ?

## 2017-10-15 NOTE — ED Triage Notes (Addendum)
Patient seen at urgent care yesterday for "raw mouth/dry mouth" x1 week that was not improving despite using mouth wash, patient states blood sugar 400-patient told to come to ER. No hx of diabetes. Patient given prescription for metformin in which she has taken 2 doses. Per patient some dizziness with generalized weakness.

## 2017-10-15 NOTE — ED Notes (Signed)
Pt seen at Urgent care yesterday due to severe dry mouth.  Pt's cbg elevated yesterday as well.  Dr. At Urgent Care started pt on Metformin yesterday, pt states she took her morning dose this morning. Pt also has had recent round of steroids for bronchitis.

## 2017-10-15 NOTE — ED Provider Notes (Signed)
[facility] EMERGENCY DEPARTMENT Provider Note   CSN: [id number] Arrival date & time: 10/15/17  1151     History   Chief Complaint Chief Complaint  Patient presents with  . Hyperglycemia    HPI [name] is a 71 y.o. female.  HPI  Polydipsia, polyuria, dry mucous membranes and generalized discomfort. Onset was sometime ago, worse over the past few days. Patient saw her physicians urgent care center yesterday, was started on metformin. However, she notes that her symptoms have progressed. No new confusion, chest pain, abdominal pain, vomiting, diarrhea.   Past Medical History:  Diagnosis Date  . Diverticulosis   . Esophageal stricture   . Gastritis   . GERD (gastroesophageal reflux disease)   . Heart valve problem   . Hypercholesterolemia    can't take meds, affects LFTs  . Hypothyroidism   . OSA on CPAP     Patient Active Problem List   Diagnosis Date Noted  . Chronic diastolic heart failure ([place]) 08/22/2017  . DOE (dyspnea on exertion)   . Chest pain 04/26/2014  . Hypothyroidism 04/26/2014  . Headache 04/26/2014  . GERD (gastroesophageal reflux disease) 04/26/2014  . OSA on CPAP 04/26/2014  . LUQ pain 09/13/2011    Past Surgical History:  Procedure Laterality Date  . APPENDECTOMY    . CHOLECYSTECTOMY    . ESOPHAGOGASTRODUODENOSCOPY  09/28/11   mild gastritis/stricture in the distal esophagus/small gastric polyps benign  . LEFT HEART CATH AND CORONARY ANGIOGRAPHY N/A 08/22/2017   Procedure: LEFT HEART CATH AND CORONARY ANGIOGRAPHY;  Surgeon: Belva Crome, MD;  Location: [facility];  Service: Cardiovascular;  Laterality: N/A;  . THYROID SURGERY  2008   left  thyroid removed  . TOTAL ABDOMINAL HYSTERECTOMY    . TUBAL LIGATION    . YAG LASER APPLICATION Right 5/00/9381   Procedure: YAG LASER APPLICATION;  Surgeon: Rutherford Guys, MD;  Location: [facility];  Service: Ophthalmology;  Laterality: Right;  . YAG LASER APPLICATION Left 08/23/9370   Procedure: YAG LASER APPLICATION;  Surgeon: Rutherford Guys, MD;  Location: [facility];  Service: Ophthalmology;  Laterality: Left;    OB History    No data available       Home Medications    Prior to Admission medications   Medication Sig Start Date End Date Taking? Authorizing Provider  ALPRAZolam Duanne Moron) 0.5 MG tablet Take 0.5 mg by mouth at bedtime as needed for anxiety.   Yes [provider]  cholecalciferol (VITAMIN D) 1000 units tablet Take 1,000 Units by mouth daily.   Yes [provider]  esomeprazole (NEXIUM) 40 MG capsule Take 40 mg by mouth daily as needed (acid reflux).    Yes [provider]  levothyroxine (SYNTHROID, LEVOTHROID) 75 MCG tablet Take 75 mcg by mouth daily before breakfast.   Yes [provider]  losartan (COZAAR) 25 MG tablet Take 1 tablet (25 mg total) by mouth daily. 07/04/17 10/15/17 Yes Herminio Commons, MD  metFORMIN (GLUCOPHAGE) 500 MG tablet Take 500 mg by mouth 2 (two) times daily.   Yes [provider]  simvastatin (ZOCOR) 20 MG tablet Take 20 mg by mouth daily.   Yes [provider]    Family History Family History  Problem Relation Age of Onset  . Colon cancer Mother        in remission, diagnosed age 58  . Anesthesia problems Neg Hx   . Hypotension Neg Hx   . Malignant hyperthermia Neg Hx   .  Pseudochol deficiency Neg Hx     Social History Social History  Substance Use Topics  . Smoking status: Never Smoker  . Smokeless tobacco: Never Used  . Alcohol use No     Allergies   Patient has no known allergies.   Review of Systems Review of Systems  Constitutional:       Per HPI, otherwise negative  HENT:       Per HPI, otherwise negative  Respiratory:       Per HPI, otherwise negative  Cardiovascular:       Per HPI, otherwise negative  Gastrointestinal: Negative for vomiting.  Endocrine: Positive for polydipsia and polyuria.       Negative aside from HPI  Genitourinary:        Neg aside from HPI   Musculoskeletal:       Per HPI, otherwise negative  Skin:       vitiglio  Neurological: Negative for syncope.     Physical Exam Updated Vital Signs BP (!) 147/76   Pulse 68   Temp 98.1 F (36.7 C) (Oral)   Resp 18   Ht 5\' 6"  (1.676 m)   Wt 81.6 kg (180 lb)   SpO2 94%   BMI 29.05 kg/m   Physical Exam  Constitutional: She is oriented to person, place, and time. She appears well-developed and well-nourished. No distress.  HENT:  Head: Normocephalic and atraumatic.  Eyes: Conjunctivae and EOM are normal.  Cardiovascular: Normal rate and regular rhythm.   Pulmonary/Chest: Effort normal and breath sounds normal. No stridor. No respiratory distress.  Abdominal: She exhibits no distension.  Musculoskeletal: She exhibits no edema.  Neurological: She is alert and oriented to person, place, and time. No cranial nerve deficit.  Skin: Skin is warm and dry.  vitiglio  Psychiatric: She has a normal mood and affect.  Nursing note and vitals reviewed.    ED Treatments / Results  Labs (all labs ordered are listed, but only abnormal results are displayed) Labs Reviewed  BASIC METABOLIC PANEL - Abnormal; Notable for the following:       Result Value   Chloride 99 (*)    Glucose, Bld 406 (*)    BUN 21 (*)    Creatinine, Ser 1.12 (*)    GFR calc non Af Amer 49 (*)    GFR calc Af Amer 56 (*)    All other components within normal limits  CBC - Abnormal; Notable for the following:    WBC 11.0 (*)    All other components within normal limits  URINALYSIS, ROUTINE W REFLEX MICROSCOPIC - Abnormal; Notable for the following:    Specific Gravity, Urine 1.042 (*)    Glucose, UA >=500 (*)    Ketones, ur 20 (*)    Leukocytes, UA TRACE (*)    Bacteria, UA RARE (*)    Squamous Epithelial / LPF 0-5 (*)    All other components within normal limits  BLOOD GAS, VENOUS - Abnormal; Notable for the following:    pCO2, Ven 38.0 (*)    pO2, Ven 58.6 (*)    All other  components within normal limits  CBG MONITORING, ED - Abnormal; Notable for the following:    Glucose-Capillary 401 (*)    All other components within normal limits    Procedures Procedures (including critical care time)  Medications Ordered in ED Medications  sodium chloride 0.9 % bolus 1,000 mL (1,000 mLs Intravenous New Bag/Given 10/15/17 1255)     Initial Impression /  Assessment and Plan / ED Course  I have reviewed the triage vital signs and the nursing notes.  Pertinent labs & imaging results that were available during my care of the patient were reviewed by me and considered in my medical decision making (see chart for details).  3:14 PM Patient awake and alert, in no distress. Patient does have hyperglycemia, but no evidence for anion gap acidosis. She is awake and alert, speaking clearly, states that she feels better, is smiling after fluid resuscitation per We discussed the importance of taking her metformin as prescribed yesterday. She has follow-up scheduled in 48 hours.  With reassuring findings, patient discharged with close outpatient follow-up.  Final Clinical Impressions(s) / ED Diagnoses  Hyperglycemia   Carmin Muskrat, MD 10/15/17 1515

## 2017-10-17 DIAGNOSIS — E785 Hyperlipidemia, unspecified: Secondary | ICD-10-CM | POA: Diagnosis not present

## 2017-10-17 DIAGNOSIS — F331 Major depressive disorder, recurrent, moderate: Secondary | ICD-10-CM | POA: Diagnosis not present

## 2017-10-17 DIAGNOSIS — R944 Abnormal results of kidney function studies: Secondary | ICD-10-CM | POA: Diagnosis not present

## 2017-10-17 DIAGNOSIS — E039 Hypothyroidism, unspecified: Secondary | ICD-10-CM | POA: Diagnosis not present

## 2017-10-17 DIAGNOSIS — E1165 Type 2 diabetes mellitus with hyperglycemia: Secondary | ICD-10-CM | POA: Diagnosis not present

## 2017-10-17 DIAGNOSIS — E559 Vitamin D deficiency, unspecified: Secondary | ICD-10-CM | POA: Diagnosis not present

## 2017-10-18 ENCOUNTER — Other Ambulatory Visit (HOSPITAL_COMMUNITY): Payer: Self-pay | Admitting: Internal Medicine

## 2017-10-18 DIAGNOSIS — Z1231 Encounter for screening mammogram for malignant neoplasm of breast: Secondary | ICD-10-CM

## 2017-10-18 DIAGNOSIS — Z780 Asymptomatic menopausal state: Secondary | ICD-10-CM

## 2017-10-25 ENCOUNTER — Ambulatory Visit (HOSPITAL_COMMUNITY)
Admission: RE | Admit: 2017-10-25 | Discharge: 2017-10-25 | Disposition: A | Discharge status: Home / Self Care | Payer: Medicare Other | Source: Ambulatory Visit | Attending: Internal Medicine | Admitting: Internal Medicine

## 2017-10-25 DIAGNOSIS — M810 Age-related osteoporosis without current pathological fracture: Secondary | ICD-10-CM | POA: Diagnosis not present

## 2017-10-25 DIAGNOSIS — Z780 Asymptomatic menopausal state: Secondary | ICD-10-CM | POA: Insufficient documentation

## 2017-10-25 DIAGNOSIS — Z13820 Encounter for screening for osteoporosis: Secondary | ICD-10-CM | POA: Diagnosis not present

## 2017-11-10 DIAGNOSIS — E559 Vitamin D deficiency, unspecified: Secondary | ICD-10-CM | POA: Diagnosis not present

## 2017-11-10 DIAGNOSIS — E1165 Type 2 diabetes mellitus with hyperglycemia: Secondary | ICD-10-CM | POA: Diagnosis not present

## 2017-11-10 DIAGNOSIS — M810 Age-related osteoporosis without current pathological fracture: Secondary | ICD-10-CM | POA: Diagnosis not present

## 2017-11-10 DIAGNOSIS — F331 Major depressive disorder, recurrent, moderate: Secondary | ICD-10-CM | POA: Diagnosis not present

## 2017-11-27 ENCOUNTER — Encounter (HOSPITAL_COMMUNITY): Payer: Medicare Other

## 2017-11-27 ENCOUNTER — Encounter (HOSPITAL_COMMUNITY)
Admission: RE | Admit: 2017-11-27 | Discharge: 2017-11-27 | Disposition: A | Discharge status: Home / Self Care | Payer: Medicare Other | Source: Ambulatory Visit | Attending: Internal Medicine | Admitting: Internal Medicine

## 2017-12-01 ENCOUNTER — Encounter (HOSPITAL_COMMUNITY)
Admission: RE | Admit: 2017-12-01 | Discharge: 2017-12-01 | Disposition: A | Discharge status: Home / Self Care | Payer: Medicare Other | Source: Ambulatory Visit | Attending: Internal Medicine | Admitting: Internal Medicine

## 2017-12-01 ENCOUNTER — Encounter (HOSPITAL_COMMUNITY): Payer: Self-pay

## 2017-12-01 DIAGNOSIS — M810 Age-related osteoporosis without current pathological fracture: Secondary | ICD-10-CM | POA: Diagnosis not present

## 2017-12-01 MED ORDER — DENOSUMAB 60 MG/ML ~~LOC~~ SOLN
60.0000 mg | Freq: Once | SUBCUTANEOUS | Status: AC
  Administered 2017-12-01: 60 mg via SUBCUTANEOUS
  Filled 2017-12-01: qty 1

## 2018-01-02 DIAGNOSIS — H5789 Other specified disorders of eye and adnexa: Secondary | ICD-10-CM | POA: Diagnosis not present

## 2018-01-02 DIAGNOSIS — H5371 Glare sensitivity: Secondary | ICD-10-CM | POA: Diagnosis not present

## 2018-01-02 DIAGNOSIS — H538 Other visual disturbances: Secondary | ICD-10-CM | POA: Diagnosis not present

## 2018-01-02 DIAGNOSIS — H524 Presbyopia: Secondary | ICD-10-CM | POA: Diagnosis not present

## 2018-01-02 DIAGNOSIS — Z961 Presence of intraocular lens: Secondary | ICD-10-CM | POA: Diagnosis not present

## 2018-01-12 IMAGING — CT CT HEAD W/O CM
3 series · 16 of 47 positions shown, 19 images · non-contrast
Comparison: 04/26/2014

CLINICAL DATA: Headache 3 days with photosensitivity.

EXAM:
CT HEAD WITHOUT CONTRAST
TECHNIQUE: Contiguous axial images were obtained from the base of the skull
through the vertex without intravenous contrast.

[Series 2: head wo · axial · 0.47mm/px · z∈[+40,+175]mm · 10 of 33 slices shown, 13 images]
[im 3/33  brain]
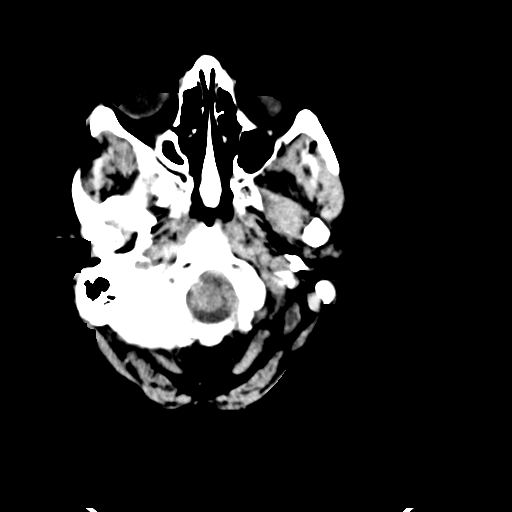
[im 3/33  bone]
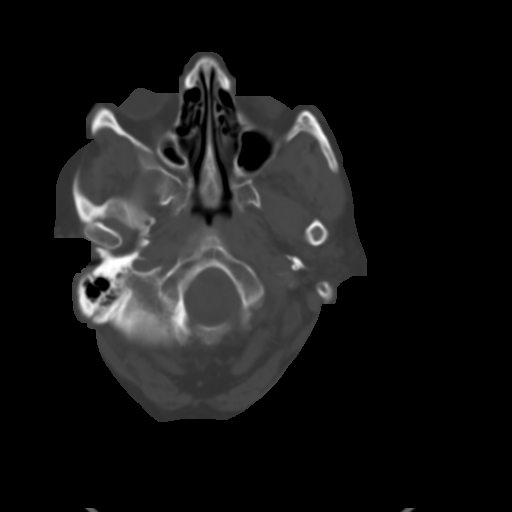
[im 6/33  brain]
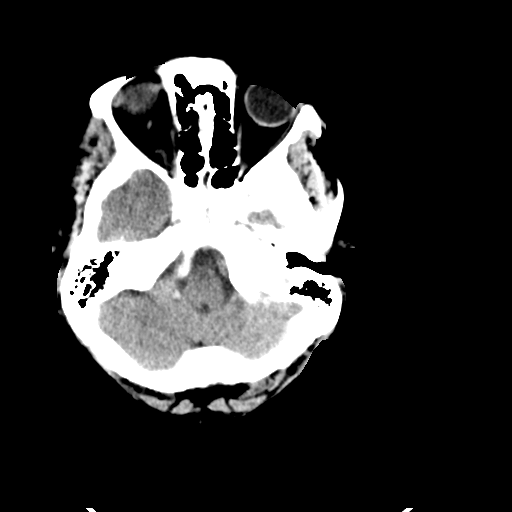
[im 9/33  brain]
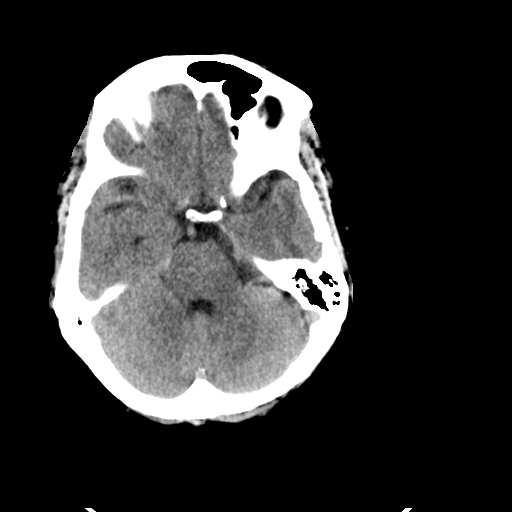
[im 12/33  brain]
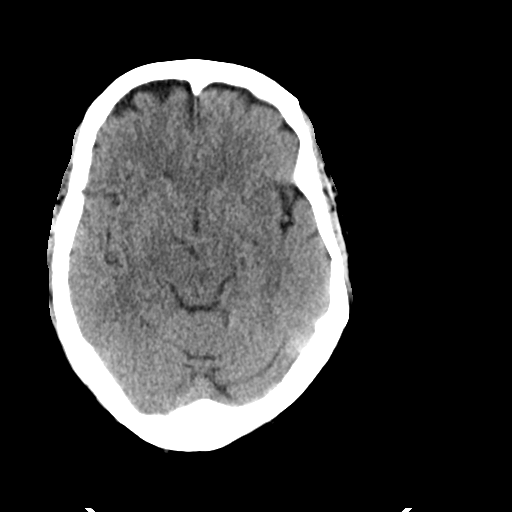
[im 15/33  brain]
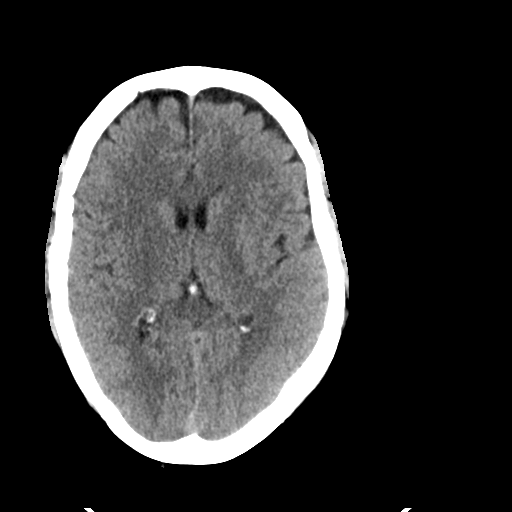
[im 15/33  bone]
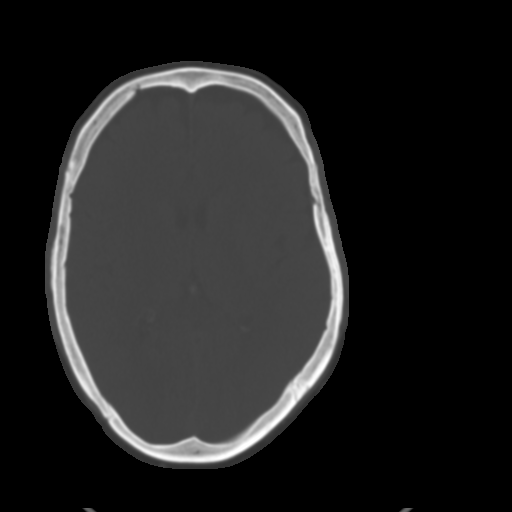
[im 18/33  brain]
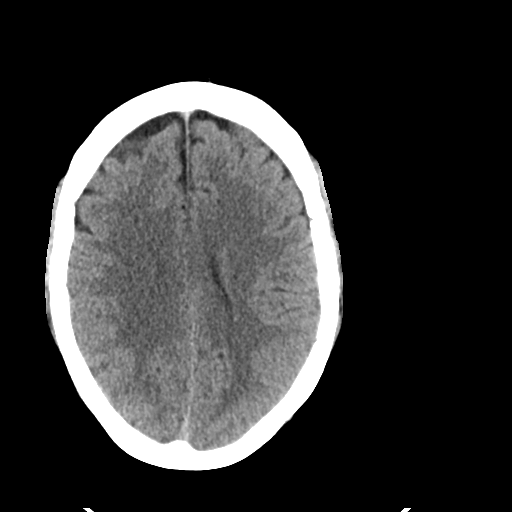
[im 21/33  brain]
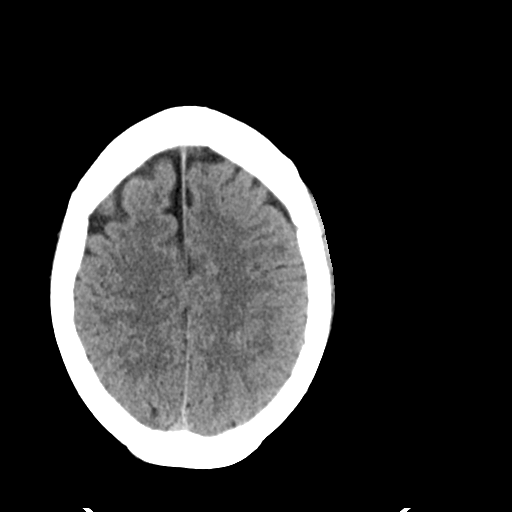
[im 25/33  brain]
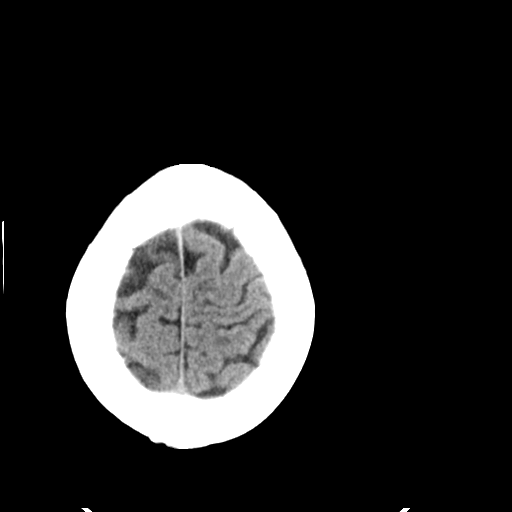
[im 27/33  brain]
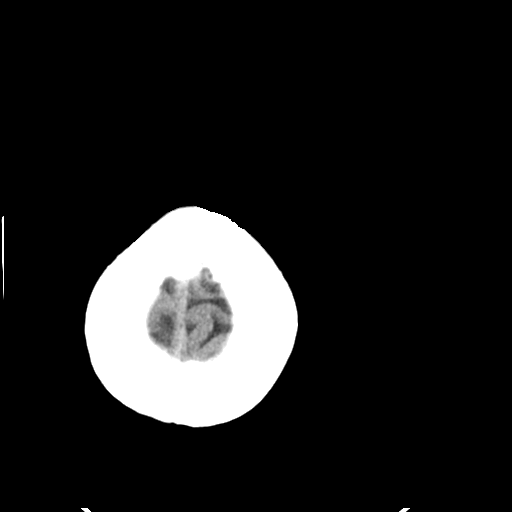
[im 27/33  bone]
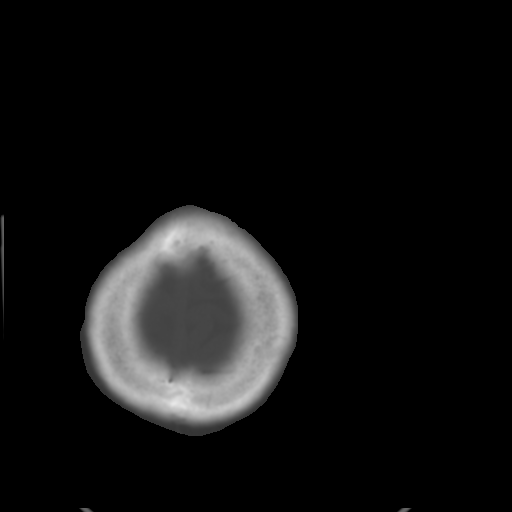
[im 30/33  brain]
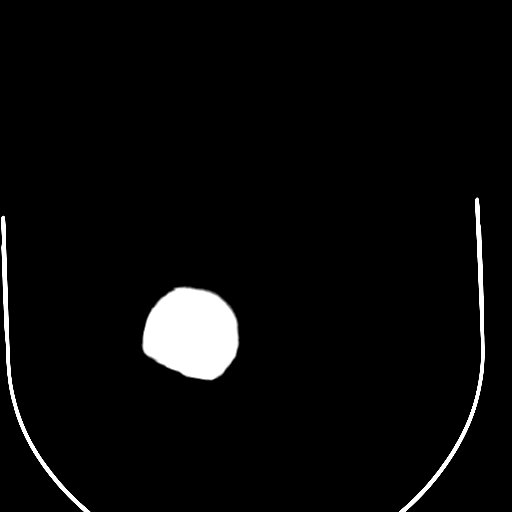

[Series 4: coronal soft tissue · coronal · 0.35mm/px · 3 of 73 slices shown]
[im 25/73  brain]
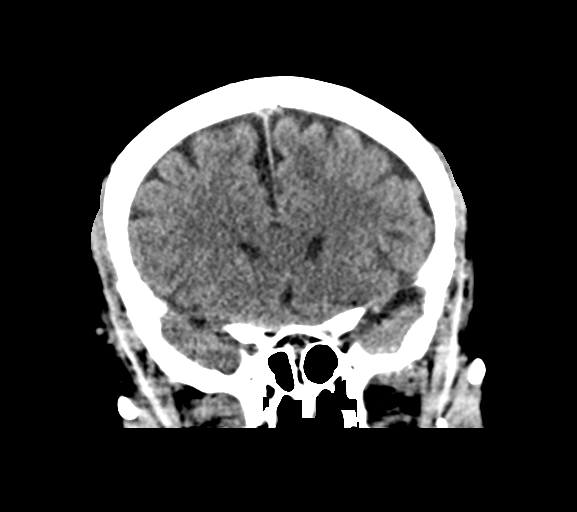
[im 33/73  brain]
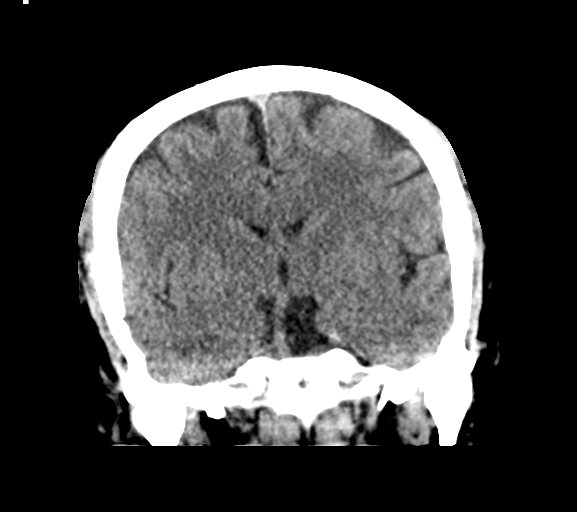
[im 41/73  brain]
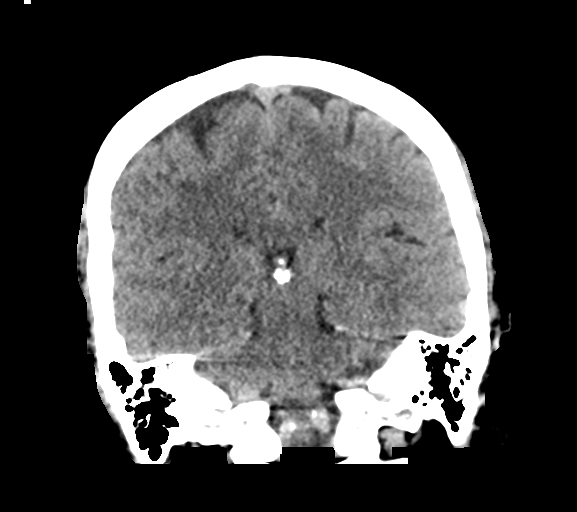

[Series 5: sagittal soft tissue · sagittal · 0.34mm/px · 3 of 67 slices shown]
[im 23/67  brain]
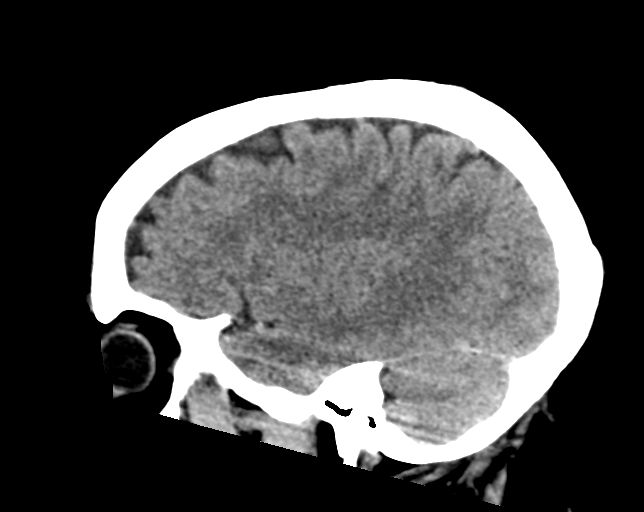
[im 34/67  brain]
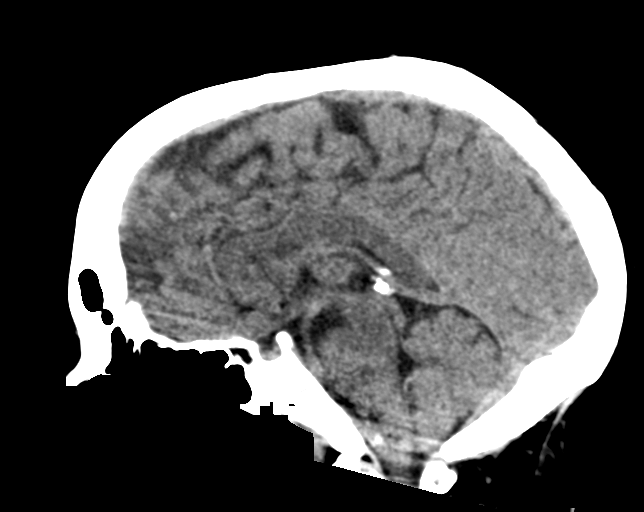
[im 45/67  brain]
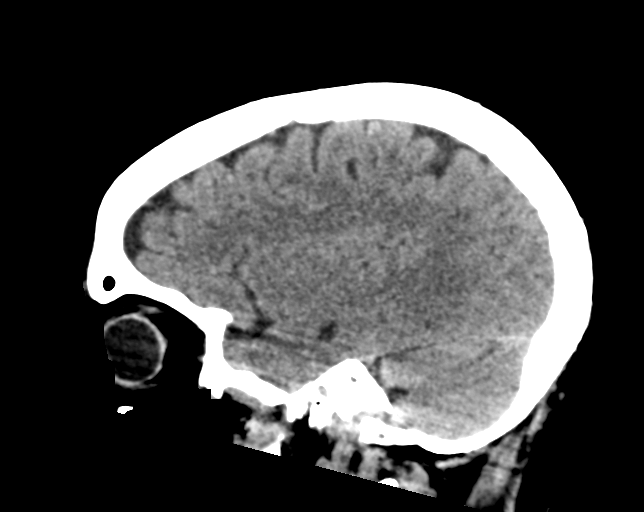

[16 of 47 positions shown; findings below may reference images not displayed]

FINDINGS: Brain: Ventricles, cisterns and other CSF spaces are within normal.
There is no mass, mass effect, shift of midline structures or acute
hemorrhage. No evidence of acute infarction.

Vascular: No hyperdense vessel or unexpected calcification.

Skull: Normal. Negative for fracture or focal lesion.

Sinuses/Orbits: No acute finding.

Other: None.
IMPRESSION: No acute intracranial findings.

## 2018-01-12 IMAGING — DX DG CHEST 2V
2 series · 2 of 2 positions shown · non-contrast
Comparison: Chest radiograph performed 02/10/2016

CLINICAL DATA: Acute onset of headache and fever. Cough and runny
nose. Initial encounter.

EXAM:
CHEST  2 VIEW

[chest lat]
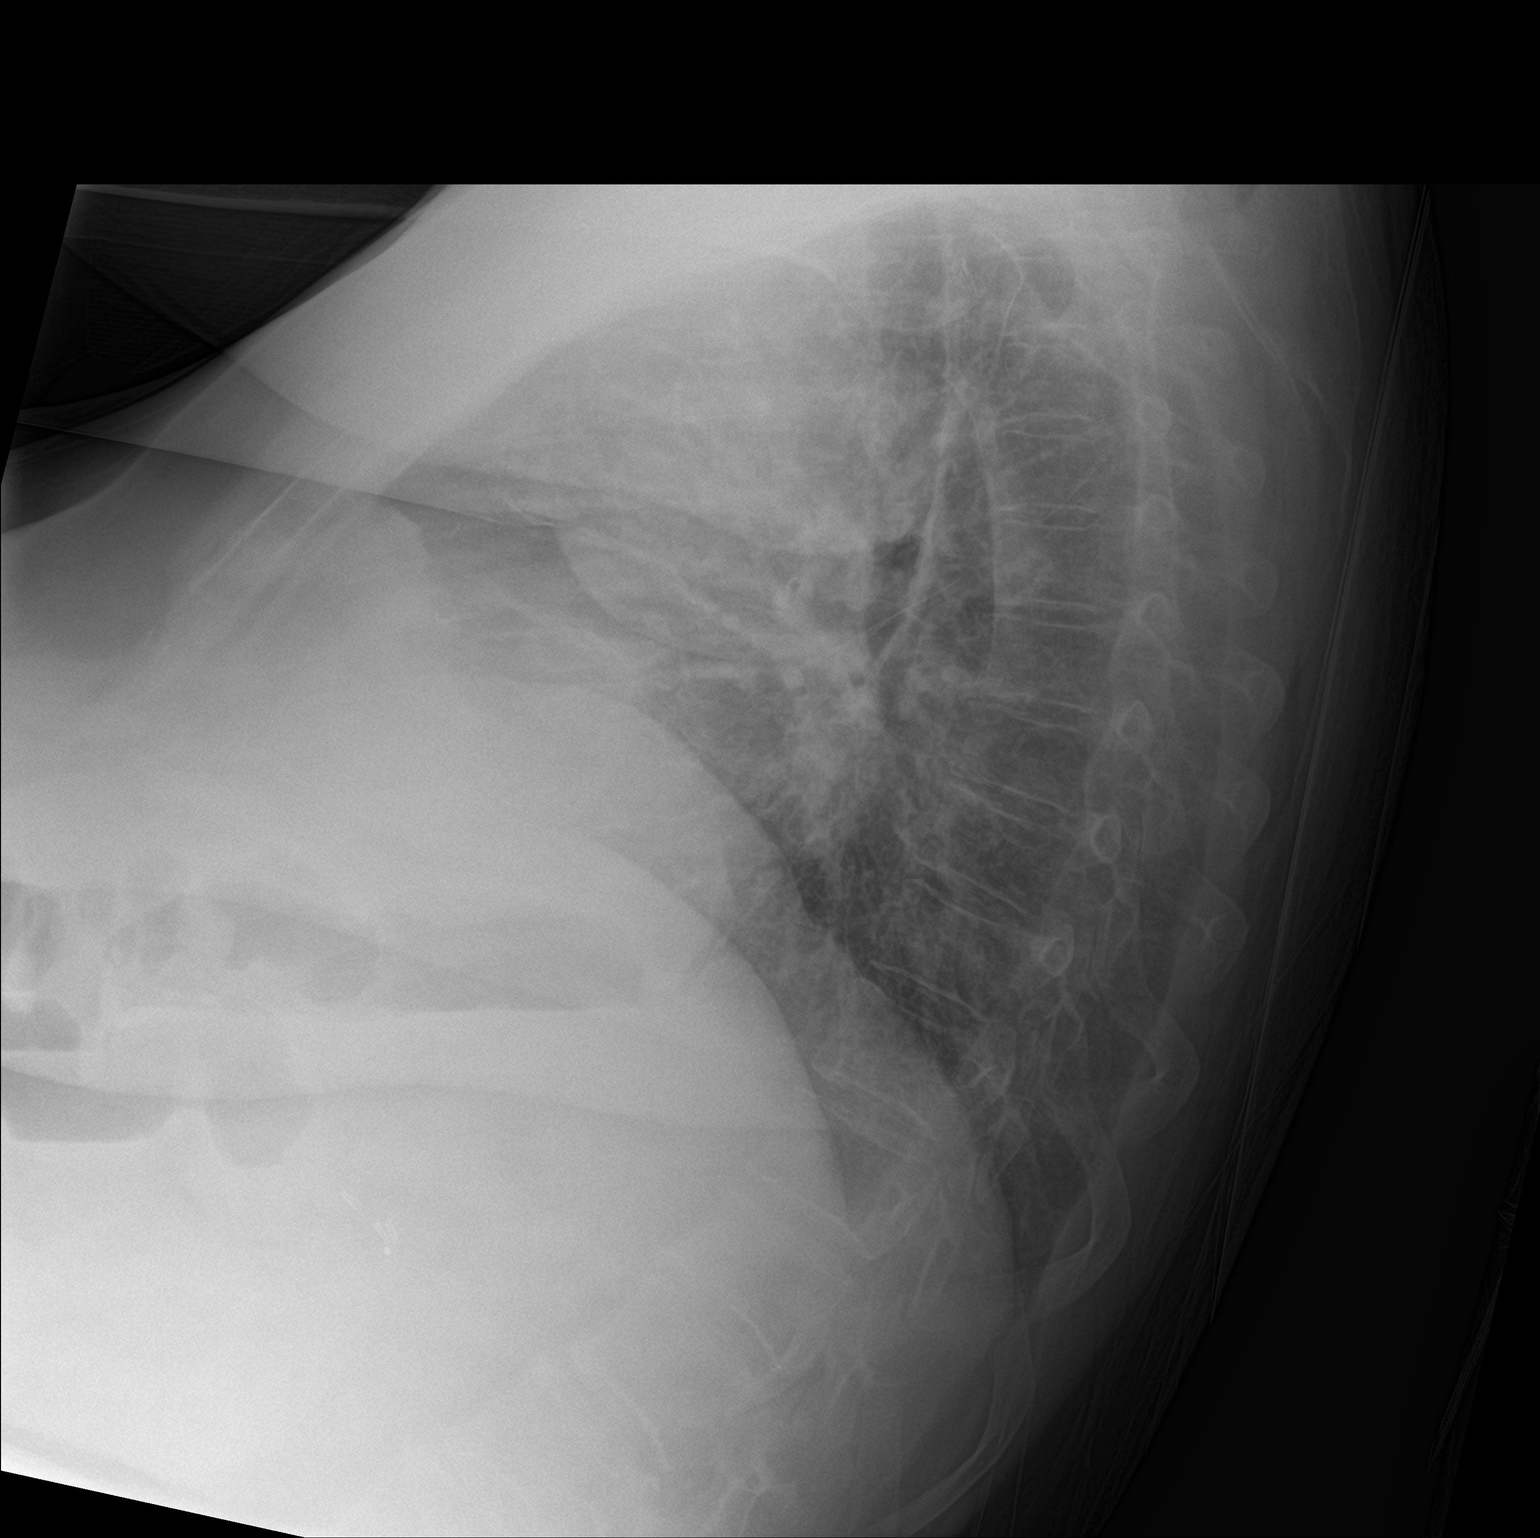

[chest ap]
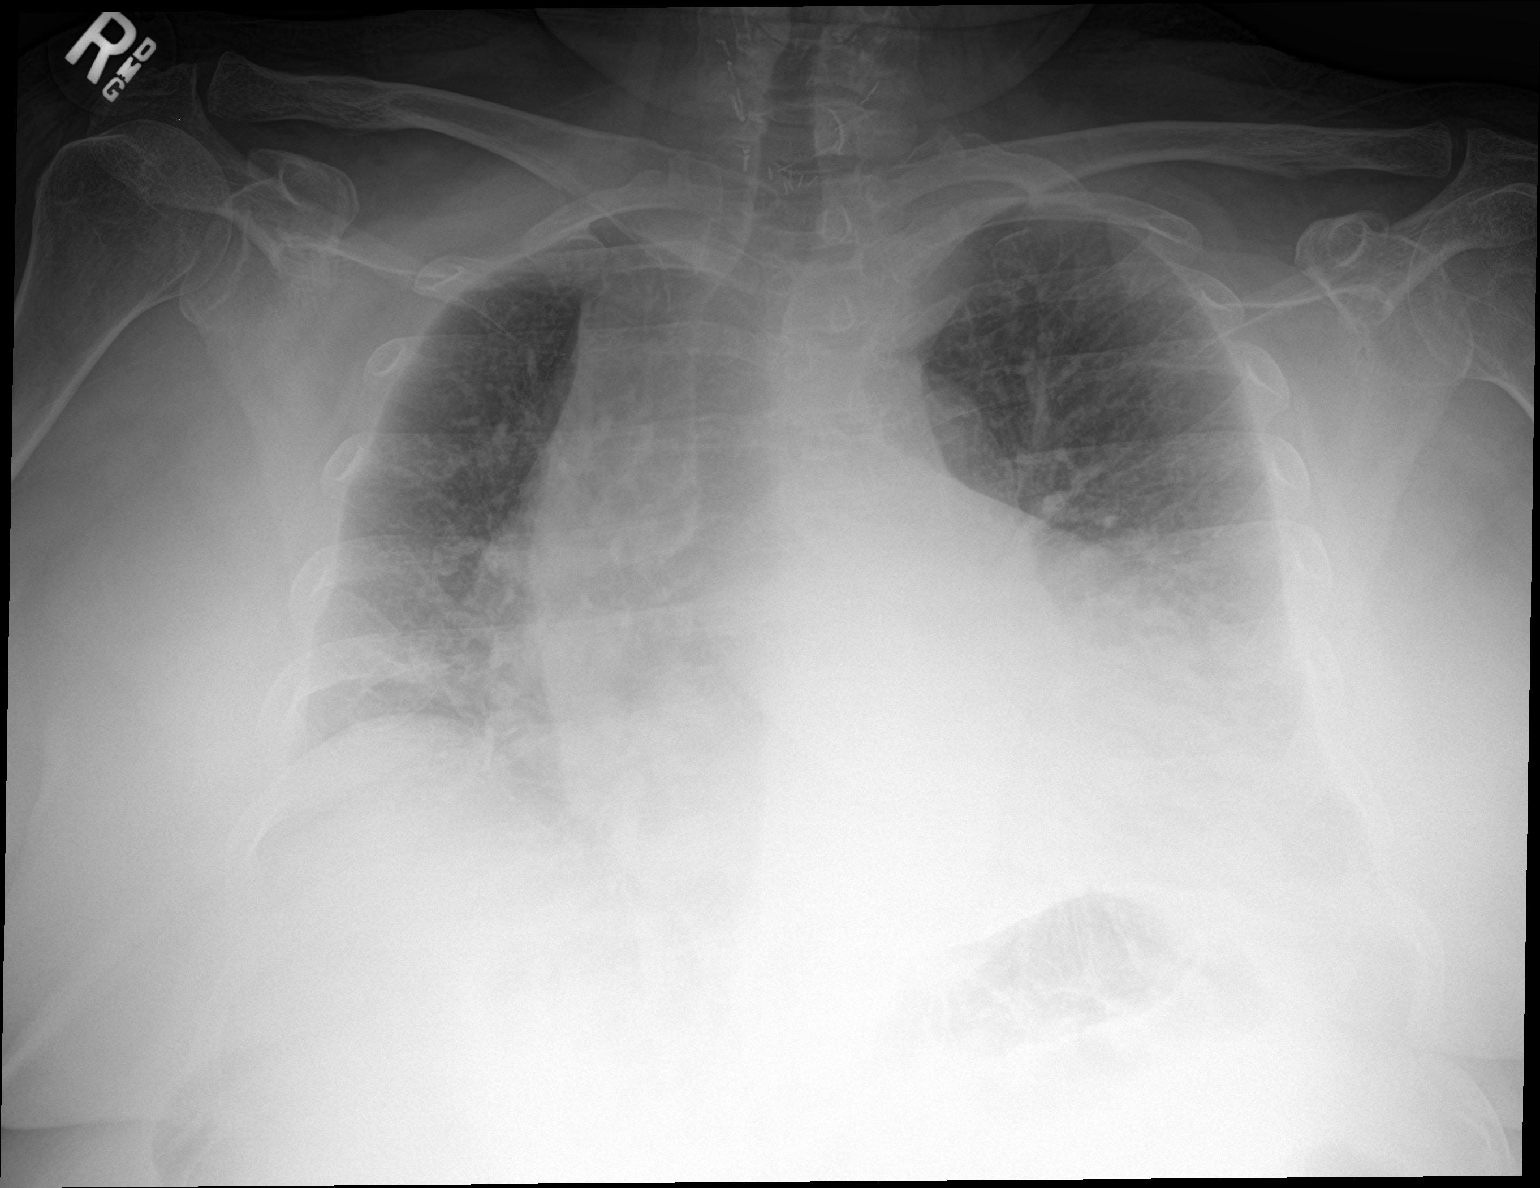

[2 of 2 positions shown; findings below may reference images not displayed]

FINDINGS: The lungs are well-aerated. Vascular congestion is noted. Bibasilar
airspace opacities may reflect interstitial edema or possibly
pneumonia. There is no evidence of pleural effusion or pneumothorax.

The heart is mildly enlarged. No acute osseous abnormalities are
seen. Clips are noted within the right upper quadrant, reflecting
prior cholecystectomy.
IMPRESSION: Vascular congestion and mild cardiomegaly. Bibasilar airspace
opacities may reflect interstitial edema or possibly pneumonia.

## 2018-02-05 DIAGNOSIS — E559 Vitamin D deficiency, unspecified: Secondary | ICD-10-CM | POA: Diagnosis not present

## 2018-02-05 DIAGNOSIS — E1165 Type 2 diabetes mellitus with hyperglycemia: Secondary | ICD-10-CM | POA: Diagnosis not present

## 2018-02-07 DIAGNOSIS — E785 Hyperlipidemia, unspecified: Secondary | ICD-10-CM | POA: Diagnosis not present

## 2018-02-07 DIAGNOSIS — M810 Age-related osteoporosis without current pathological fracture: Secondary | ICD-10-CM | POA: Diagnosis not present

## 2018-02-07 DIAGNOSIS — E559 Vitamin D deficiency, unspecified: Secondary | ICD-10-CM | POA: Diagnosis not present

## 2018-02-07 DIAGNOSIS — E039 Hypothyroidism, unspecified: Secondary | ICD-10-CM | POA: Diagnosis not present

## 2018-02-07 DIAGNOSIS — I10 Essential (primary) hypertension: Secondary | ICD-10-CM | POA: Diagnosis not present

## 2018-02-07 DIAGNOSIS — E1165 Type 2 diabetes mellitus with hyperglycemia: Secondary | ICD-10-CM | POA: Diagnosis not present

## 2018-02-28 ENCOUNTER — Encounter: Payer: Self-pay | Admitting: Cardiovascular Disease

## 2018-02-28 ENCOUNTER — Ambulatory Visit (INDEPENDENT_AMBULATORY_CARE_PROVIDER_SITE_OTHER): Payer: Medicare Other | Admitting: Cardiovascular Disease

## 2018-02-28 VITALS — BP 140/78 | HR 60 | Ht 66.0 in | Wt 187.0 lb

## 2018-02-28 DIAGNOSIS — E119 Type 2 diabetes mellitus without complications: Secondary | ICD-10-CM

## 2018-02-28 DIAGNOSIS — I10 Essential (primary) hypertension: Secondary | ICD-10-CM | POA: Diagnosis not present

## 2018-02-28 DIAGNOSIS — E7800 Pure hypercholesterolemia, unspecified: Secondary | ICD-10-CM

## 2018-02-28 DIAGNOSIS — G4730 Sleep apnea, unspecified: Secondary | ICD-10-CM

## 2018-02-28 DIAGNOSIS — I5032 Chronic diastolic (congestive) heart failure: Secondary | ICD-10-CM

## 2018-02-28 NOTE — Progress Notes (Signed)
SUBJECTIVE: The patient presents for routine follow-up.  She underwent coronary angiography which demonstrated normal coronary arteries on 08/22/17.  She had a mild elevation in end-diastolic pressure compatible with chronic diastolic heart failure.  There were failed attempts at right heart catheterization due to inability to access the venous system from either upper extremity.  She refused a femoral approach.  Echocardiogram on 07/05/17 demonstrated normal left ventricular systolic function, LVEF 50-09%, grade 1 diastolic dysfunction, with elevated ventricular filling pressures.  There was mild aortic and mitral regurgitation.  I prescribed Lasix for 3 days in August 2018 but symptoms did not improve.  She denies chest pain and palpitations.  She feels fatigued and said she sleeps 2 hours every night.  She said she has significant sleep apnea but is intolerant of CPAP due to claustrophobia.  She has some mild ankle and feet swelling if she stands for prolonged periods of time.    Review of Systems: As per "subjective", otherwise negative.  No Known Allergies  Current Outpatient Medications  Medication Sig Dispense Refill  . ALPRAZolam (XANAX) 1 MG tablet Take 1 mg by mouth at bedtime as needed for anxiety.    . Cholecalciferol (VITAMIN D3) 5000 units TABS Take by mouth.    . esomeprazole (NEXIUM) 40 MG capsule Take 40 mg by mouth daily as needed (acid reflux).     Marland Kitchen glipiZIDE (GLUCOTROL) 10 MG tablet Take 10 mg by mouth daily before breakfast.    . levothyroxine (SYNTHROID, LEVOTHROID) 75 MCG tablet Take 75 mcg by mouth daily before breakfast.    . losartan (COZAAR) 25 MG tablet Take 1 tablet (25 mg total) by mouth daily. 90 tablet 3  . metFORMIN (GLUCOPHAGE) 500 MG tablet Take 500 mg by mouth 2 (two) times daily.    . simvastatin (ZOCOR) 20 MG tablet Take 20 mg by mouth daily.     No current facility-administered medications for this visit.     Past Medical History:    Diagnosis Date  . Diverticulosis   . DM (diabetes mellitus) ([place])   . Esophageal stricture   . Gastritis   . GERD (gastroesophageal reflux disease)   . Heart valve problem   . Hypercholesterolemia    can't take meds, affects LFTs  . Hypothyroidism   . OSA on CPAP     Past Surgical History:  Procedure Laterality Date  . APPENDECTOMY    . CHOLECYSTECTOMY    . ESOPHAGOGASTRODUODENOSCOPY  09/28/11   mild gastritis/stricture in the distal esophagus/small gastric polyps benign  . LEFT HEART CATH AND CORONARY ANGIOGRAPHY N/A 08/22/2017   Procedure: LEFT HEART CATH AND CORONARY ANGIOGRAPHY;  Surgeon: Belva Crome, MD;  Location: [facility];  Service: Cardiovascular;  Laterality: N/A;  . THYROID SURGERY  2008   left  thyroid removed  . TOTAL ABDOMINAL HYSTERECTOMY    . TUBAL LIGATION    . YAG LASER APPLICATION Right 3/81/8299   Procedure: YAG LASER APPLICATION;  Surgeon: Rutherford Guys, MD;  Location: [facility];  Service: Ophthalmology;  Laterality: Right;  . YAG LASER APPLICATION Left 3/71/6967   Procedure: YAG LASER APPLICATION;  Surgeon: Rutherford Guys, MD;  Location: [facility];  Service: Ophthalmology;  Laterality: Left;    Social History   Socioeconomic History  . Marital status: Widowed    Spouse name: Not on file  . Number of children: 3  . Years of education: Not on file  . Highest education level: Not on file  Social  Needs  . Financial resource strain: Not on file  . Food insecurity - worry: Not on file  . Food insecurity - inability: Not on file  . Transportation needs - medical: Not on file  . Transportation needs - non-medical: Not on file  Occupational History  . Occupation: retired    Comment: [employer]   Tobacco Use  . Smoking status: Never Smoker  . Smokeless tobacco: Never Used  Substance and Sexual Activity  . Alcohol use: No    Alcohol/week: 0.0 oz  . Drug use: No  . Sexual activity: Not on file  Other Topics Concern  . Not on file  Social  History Narrative  . Not on file     Vitals:   02/28/18 1042  BP: 140/78  Pulse: 60  SpO2: 98%  Weight: 187 lb (84.8 kg)  Height: 5\' 6"  (1.676 m)    Wt Readings from Last 3 Encounters:  02/28/18 187 lb (84.8 kg)  12/01/17 173 lb (78.5 kg)  10/15/17 180 lb (81.6 kg)     PHYSICAL EXAM General: NAD HEENT: Normal. Neck: No JVD, no thyromegaly. Lungs: Clear to auscultation bilaterally with normal respiratory effort. CV: Regular rate and rhythm, normal S1/S2, no S3/S4, no murmur. No pretibial or periankle edema.  No carotid bruit.   Abdomen: Soft, nontender, no distention.  Neurologic: Alert and oriented.  Psych: Normal affect. Skin: Normal. Musculoskeletal: No Fariss deformities.    ECG: Most recent ECG reviewed.   Labs: Lab Results  Component Value Date/Time   K 3.8 10/15/2017 12:46 PM   BUN 21 (H) 10/15/2017 12:46 PM   BUN 25 07/13/2017 08:40 AM   CREATININE 1.12 (H) 10/15/2017 12:46 PM   CREATININE 1.10 (H) 08/21/2017 09:35 AM   ALT 26 04/26/2014 12:20 PM   TSH 3.921 08/05/2017 09:31 AM   TSH 1.110 04/26/2014 12:20 PM   HGB 14.0 10/15/2017 12:46 PM     Lipids: Lab Results  Component Value Date/Time   LDLCALC 126 (H) 04/27/2014 06:16 AM   CHOL 215 (H) 04/27/2014 06:16 AM   TRIG 197 (H) 04/27/2014 06:16 AM   HDL 50 04/27/2014 06:16 AM       ASSESSMENT AND PLAN: 1.  Chronic diastolic heart failure: Cardiac catheterization and echocardiogram details noted above.  Weight is up 14 pounds since 12/01/17.  She does not require diuretics at this time.  2.  Hypertension: Blood pressure is mildly elevated.  She is on losartan 25 mg daily.  This is most certainly being exacerbated by sleep apnea which I will attempt to address.  3.  Hyperlipidemia: Continue simvastatin 20 mg.  4.  Type 2 diabetes mellitus: Currently on metformin and glipizide.  5.  Sleep apnea: I will make a referral to Dr. Rexene Alberts in [place] to see if there are any alternative options for  management, as this has led to a poor quality of life.   Disposition: Follow up 1 yr   Kate Sable, M.D., F.A.C.C.

## 2018-02-28 NOTE — Patient Instructions (Signed)
Medication Instructions:  Your physician recommends that you continue on your current medications as directed. Please refer to the Current Medication list given to you today.   Labwork: NONE  Testing/Procedures: NONE  Follow-Up: Your physician wants you to follow-up in: 1 YEAR.  You will receive a reminder letter in the mail two months in advance. If you don't receive a letter, please call our office to schedule the follow-up appointment.   Any Other Special Instructions Will Be Listed Below (If Applicable). You have been referred to [facility] (DR. Star Age). Someone from that office will contact you soon .      If you need a refill on your cardiac medications before your next appointment, please call your pharmacy.

## 2018-04-30 ENCOUNTER — Encounter: Payer: Self-pay | Admitting: Neurology

## 2018-04-30 ENCOUNTER — Ambulatory Visit (INDEPENDENT_AMBULATORY_CARE_PROVIDER_SITE_OTHER): Payer: Medicare Other | Admitting: Neurology

## 2018-04-30 VITALS — BP 118/78 | HR 60 | Ht 67.0 in | Wt 185.0 lb

## 2018-04-30 DIAGNOSIS — G478 Other sleep disorders: Secondary | ICD-10-CM | POA: Diagnosis not present

## 2018-04-30 DIAGNOSIS — G2581 Restless legs syndrome: Secondary | ICD-10-CM | POA: Diagnosis not present

## 2018-04-30 DIAGNOSIS — R351 Nocturia: Secondary | ICD-10-CM | POA: Diagnosis not present

## 2018-04-30 DIAGNOSIS — R51 Headache: Secondary | ICD-10-CM | POA: Diagnosis not present

## 2018-04-30 DIAGNOSIS — G4733 Obstructive sleep apnea (adult) (pediatric): Secondary | ICD-10-CM

## 2018-04-30 DIAGNOSIS — G4719 Other hypersomnia: Secondary | ICD-10-CM | POA: Diagnosis not present

## 2018-04-30 DIAGNOSIS — R519 Headache, unspecified: Secondary | ICD-10-CM

## 2018-04-30 DIAGNOSIS — I5032 Chronic diastolic (congestive) heart failure: Secondary | ICD-10-CM

## 2018-04-30 NOTE — Progress Notes (Signed)
Subjective:    Patient ID: [name] is a 72 y.o. female.  HPI     Star Age, MD, PhD [facility] [address], [address].O. [place], [place] [address]  Dear Jamesetta So,   I saw your patient, [name], upon your kind request in my neurologic clinic today for initial consultation of her sleep disorder, in particular re-evaluation of her prior diagnosis of obstructive sleep apnea. The patient is unaccompanied today. As you know, [name] is a 72 year old right-handed woman with an underlying medical history of hypertension, chronic diastolic congestive heart failure, hyperlipidemia, type 2 diabetes, hypothyroidism, reflux disease, diverticulosis and overweight state, who was previously diagnosed with obstructive sleep apnea and placed on CPAP therapy. She reports that she could not tolerate CPAP due to claustrophobia. Preoperative sleep study results from 02/12/2013 and 03/06/2013, respectively, were reviewed. Her sleep study from February 2014 showed an AHI of 15 per hour, O2 nadir was 77%. Her sleep study from March 2014 showed an optimal CPAP pressure of 10 cm. She reports that she has not used CPAP in many months. A CPAP compliance download is not possible, she did not bring her machine. Her BMI was reported to be 27 at the time. I reviewed your office note from 02/28/2018. Her Epworth sleepiness score is 10 out of 24 today, fatigue score is 41 out of 63. She is widowed, she has 3 children, but lost her youngest daughter at age 15 to lung cancer nearly 10 years ago, she is retired from Radio broadcast assistant work for 25-30 years.she has chronic difficulty with sleep initiation and sleep maintenance. She has tried over-the-counter and prescription sleep aids including melatonin which did not help, Lunesta and trazodone which did not help and Ambien caused side effects. She also endorses restless leg symptoms. These are intermittent and not severe. She has woken up  with a headache occasionally. She is reluctant but would be willing to get retested for sleep apnea and consider CPAP therapy again but recalls that she was not able to tolerate a full facemask due to claustrophobia. Bedtime is around 9 and rise time between 5 and 7. She has nocturia about once per average night. She is not aware of any family history of OSA. She lives with her oldest daughter. She has a middle son. She has 7 grandchildren and 4 great-grandchildren. She is a nonsmoker and does not utilize alcohol or caffeine on a daily basis.  Her Past Medical History Is Significant For: Past Medical History:  Diagnosis Date  . Diverticulosis   . DM (diabetes mellitus) ([place])   . Esophageal stricture   . Gastritis   . GERD (gastroesophageal reflux disease)   . Heart valve problem   . Hypercholesterolemia    can't take meds, affects LFTs  . Hypothyroidism   . OSA on CPAP     Her Past Surgical History Is Significant For: Past Surgical History:  Procedure Laterality Date  . APPENDECTOMY    . CHOLECYSTECTOMY    . ESOPHAGOGASTRODUODENOSCOPY  09/28/11   mild gastritis/stricture in the distal esophagus/small gastric polyps benign  . LEFT HEART CATH AND CORONARY ANGIOGRAPHY N/A 08/22/2017   Procedure: LEFT HEART CATH AND CORONARY ANGIOGRAPHY;  Surgeon: Belva Crome, MD;  Location: [place] [facility];  Service: Cardiovascular;  Laterality: N/A;  . THYROID SURGERY  2008   left  thyroid removed  . TOTAL ABDOMINAL HYSTERECTOMY    . TUBAL LIGATION    . YAG LASER APPLICATION Right  02/21/2017   Procedure: YAG LASER APPLICATION;  Surgeon: Rutherford Guys, MD;  Location: [facility];  Service: Ophthalmology;  Laterality: Right;  . YAG LASER APPLICATION Left 8/36/6294   Procedure: YAG LASER APPLICATION;  Surgeon: Rutherford Guys, MD;  Location: [facility];  Service: Ophthalmology;  Laterality: Left;    Her Family History Is Significant For: Family History  Problem Relation Age of Onset  . Colon cancer Mother         in remission, diagnosed age 65  . Anesthesia problems Neg Hx   . Hypotension Neg Hx   . Malignant hyperthermia Neg Hx   . Pseudochol deficiency Neg Hx     Her Social History Is Significant For: Social History   Socioeconomic History  . Marital status: Widowed    Spouse name: Not on file  . Number of children: 3  . Years of education: Not on file  . Highest education level: Not on file  Occupational History  . Occupation: retired    Comment: Psychologist, prison and probation services   Social Needs  . Financial resource strain: Not on file  . Food insecurity:    Worry: Not on file    Inability: Not on file  . Transportation needs:    Medical: Not on file    Non-medical: Not on file  Tobacco Use  . Smoking status: Never Smoker  . Smokeless tobacco: Never Used  Substance and Sexual Activity  . Alcohol use: No    Alcohol/week: 0.0 oz  . Drug use: No  . Sexual activity: Not on file  Lifestyle  . Physical activity:    Days per week: Not on file    Minutes per session: Not on file  . Stress: Not on file  Relationships  . Social connections:    Talks on phone: Not on file    Gets together: Not on file    Attends religious service: Not on file    Active member of club or organization: Not on file    Attends meetings of clubs or organizations: Not on file    Relationship status: Not on file  Other Topics Concern  . Not on file  Social History Narrative  . Not on file    Her Allergies Are:  No Known Allergies:   Her Current Medications Are:  Outpatient Encounter Medications as of 04/30/2018  Medication Sig  . ALPRAZolam (XANAX) 1 MG tablet Take 1 mg by mouth at bedtime as needed for anxiety.  . Cholecalciferol (VITAMIN D3) 5000 units TABS Take by mouth.  . esomeprazole (NEXIUM) 40 MG capsule Take 40 mg by mouth daily as needed (acid reflux).   Marland Kitchen glipiZIDE (GLUCOTROL) 10 MG tablet Take 10 mg by mouth daily before breakfast.  . levothyroxine (SYNTHROID, LEVOTHROID) 75 MCG tablet Take 75 mcg by  mouth daily before breakfast.  . metFORMIN (GLUCOPHAGE) 500 MG tablet Take 500 mg by mouth 2 (two) times daily.  . simvastatin (ZOCOR) 20 MG tablet Take 20 mg by mouth daily.  Marland Kitchen losartan (COZAAR) 25 MG tablet Take 1 tablet (25 mg total) by mouth daily.   No facility-administered encounter medications on file as of 04/30/2018.   :  Review of Systems:  Out of a complete 14 point review of systems, all are reviewed and negative with the exception of these symptoms as listed below:  Review of Systems  Neurological:       Pt presents today to discuss her sleep. Pt has had sleep studies in the past at [facility]  hospital. Pt has a cpap at home but cannot tolerate it. She has not worn her cpap in many months and did not bring it today. Pt used [facility] for her DME.  Epworth Sleepiness Scale 0= would never doze 1= slight chance of dozing 2= moderate chance of dozing 3= high chance of dozing  Sitting and reading: 2 Watching TV: 2 Sitting inactive in a public place (ex. Theater or meeting): 2 As a passenger in a car for an hour without a break: 2 Lying down to rest in the afternoon: 0 Sitting and talking to someone: 0 Sitting quietly after lunch (no alcohol): 0 In a car, while stopped in traffic: 2 Total: 10     Objective:  Neurological Exam  Physical Exam Physical Examination:   Vitals:   04/30/18 0832  BP: 118/78  Pulse: 60    General Examination: The patient is a very pleasant 72 y.o. female in no acute distress. She appears well-developed and well-nourished and well groomed.   HEENT: Normocephalic, atraumatic, pupils are equal, round and reactive to light and accommodation. Extraocular tracking is good without limitation to gaze excursion or nystagmus noted. Normal smooth pursuit is noted. Hearing is grossly intact. Tympanic membranes are clear bilaterally. Face is symmetric with normal facial animation and normal facial sensation. Speech is clear with no dysarthria noted. There is  no hypophonia. There is no lip, neck/head, jaw or voice tremor. Neck is supple with full range of passive and active motion. There are no carotid bruits on auscultation. Oropharynx exam reveals: moderate mouth dryness, adequate dental hygiene with several missing teeth, and mild airway crowding, due to smaller airway entry, tonsils of 1+. Mallampati is class II. Tongue protrudes centrally and palate elevates symmetrically. Her neck circumference is 15-1/4 inches. She has no overbite.  Chest: Clear to auscultation without wheezing, rhonchi or crackles noted.  Heart: S1+S2+0, regular and normal without murmurs, rubs or gallops noted.   Abdomen: Soft, non-tender and non-distended with normal bowel sounds appreciated on auscultation.  Extremities: There is trace pitting edema in the distal lower extremities bilaterally in the feet and ankle area only. Pedal pulses are intact.  Skin: Warm and dry without trophic changes noted. There are spider veins and mild varicose veins. She has patchy depigmentation in keeping with vitiligo.   Musculoskeletal: exam reveals no obvious joint deformities, tenderness or joint swelling or erythema.   Neurologically:  Mental status: The patient is awake, alert and oriented in all 4 spheres. Her immediate and remote memory, attention, language skills and fund of knowledge are appropriate. There is no evidence of aphasia, agnosia, apraxia or anomia. Speech is clear with normal prosody and enunciation. Thought process is linear. Mood is normal and affect is normal.  Cranial nerves II - XII are as described above under HEENT exam. In addition: shoulder shrug is normal with equal shoulder height noted. Motor exam: Normal bulk, strength and tone is noted. There is no drift, tremor or rebound. Romberg is negative, except for mild swaying. Fine motor skills and coordination: intact with normal finger taps, normal hand movements, normal rapid alternating patting, normal foot taps  and normal foot agility.  Cerebellar testing: No dysmetria or intention tremor. There is no truncal or gait ataxia.  Sensory exam: intact to light touch in the upper and lower extremities.  Gait, station and balance: She stands easily. No veering to one side is noted. No leaning to one side is noted. Posture is age-appropriate and stance is narrow based. Gait shows  normal stride length and normal pace. No problems turning are noted. Tandem walk is challenging for her.   Assessment and Plan:  In summary, [name] is a very plareeasant 72 y.o.-year old female with an underlying medical history of hypertension, chronic diastolic congestive heart failure, hyperlipidemia, type 2 diabetes, hypothyroidism, reflux disease, diverticulosis and overweight state, whose history and physical exam are in keeping with obstructive sleep apnea (OSA). I had a long chat with the patientno about my findings and the diagnosis of OSA, its prognosis and treatment options. We talked about medical treatments, surgical interventions and non-pharmacological approaches. I explained in particular the risks and ramifications of untreated moderate to severe OSA, especially with respect to developing cardiovascular disease down the Road, including congestive heart failure, difficult to treat hypertension, cardiac arrhythmias, or stroke. Even type 2 diabetes has, in part, been linked to untreated OSA. Symptoms of untreated OSA include daytime sleepiness, memory problems, mood irritability and mood disorder such as depression and anxiety, lack of energy, as well as recurrent headaches, especially morning headaches. We talked about trying to maintain a healthy lifestyle in general, as well as the importance of weight control. I encouraged the patient to eat healthy, exercise daily and keep well hydrated, to keep a scheduled bedtime and wake time routine, to not skip any meals and eat healthy snacks in between meals. I advised the patient  not to drive when feeling sleepy. I recommended the following at this time: sleep study with potential positive airway pressure titration. (We will score hypopneas at 4%).   I explained the sleep test procedure to the patient and also outlined possible surgical and non-surgical treatment options of OSA, including the use of a custom-made dental device (which would require a referral to a specialist dentist or oral surgeon), upper airway surgical options, such as pillar implants, radiofrequency surgery, tongue base surgery, and UPPP (which would involve a referral to an ENT surgeon). Rarely, jaw surgery such as mandibular advancement may be considered.  I also explained the CPAP treatment option to the patient, who indicated that she would be willing to try CPAP if the need arises. I explained the importance of being compliant with PAP treatment, not only for insurance purposes but primarily to improve Her symptoms, and for the patient's long term health benefit, including to reduce Her cardiovascular risks. I answered all her questions today and the patient was in agreement. I would like to see her back after the sleep study is completed and encouraged her to call with any interim questions, concerns, problems or updates.   Thank you very much for allowing me to participate in the care of this nice patient. If I can be of any further assistance to you please do not hesitate to call me at [phone].  Sincerely,   Star Age, MD, PhD

## 2018-04-30 NOTE — Patient Instructions (Signed)
Thank you for choosing [facility] for your sleep related care! It was nice to meet you today! I appreciate that you entrust me with your sleep related healthcare concerns. I hope, I was able to address at least some of your concerns today, and that I can help you feel reassured and also get better.    Here is what we discussed today and what we came up with as our plan for you:    Based on your symptoms and your exam I believe you are still at risk for obstructive sleep apnea and would benefit from reevaluation as it has been some years and you would benefit from re-evaluation. Therefore, I think we should proceed with a sleep study to determine how severe your sleep apnea is. If you have more than mild OSA, I want you to consider ongoing treatment with CPAP. Please remember, the risks and ramifications of moderate to severe obstructive sleep apnea or OSA are: Cardiovascular disease, including congestive heart failure, stroke, difficult to control hypertension, arrhythmias, and even type 2 diabetes has been linked to untreated OSA. Sleep apnea causes disruption of sleep and sleep deprivation in most cases, which, in turn, can cause recurrent headaches, problems with memory, mood, concentration, focus, and vigilance. Most people with untreated sleep apnea report excessive daytime sleepiness, which can affect their ability to drive. Please do not drive if you feel sleepy.   I will likely see you back after your sleep study to go over the test results and where to go from there. We will call you after your sleep study to advise about the results (most likely, you will hear from [place], my nurse) and to set up an appointment at the time, as necessary.    Our sleep lab administrative assistant will call you to schedule your sleep study. If you don't hear back from her by about 2 weeks from now, please feel free to call her at [phone]. You can leave a message with your phone number and  concerns, if you get the voicemail box. She will call back as soon as possible.

## 2018-05-24 ENCOUNTER — Ambulatory Visit (INDEPENDENT_AMBULATORY_CARE_PROVIDER_SITE_OTHER): Payer: Medicare Other | Admitting: Neurology

## 2018-05-24 DIAGNOSIS — R351 Nocturia: Secondary | ICD-10-CM

## 2018-05-24 DIAGNOSIS — R519 Headache, unspecified: Secondary | ICD-10-CM

## 2018-05-24 DIAGNOSIS — G4733 Obstructive sleep apnea (adult) (pediatric): Secondary | ICD-10-CM | POA: Diagnosis not present

## 2018-05-24 DIAGNOSIS — G4719 Other hypersomnia: Secondary | ICD-10-CM

## 2018-05-24 DIAGNOSIS — I5032 Chronic diastolic (congestive) heart failure: Secondary | ICD-10-CM

## 2018-05-24 DIAGNOSIS — G478 Other sleep disorders: Secondary | ICD-10-CM

## 2018-05-24 DIAGNOSIS — R51 Headache: Secondary | ICD-10-CM

## 2018-05-24 DIAGNOSIS — G4720 Circadian rhythm sleep disorder, unspecified type: Secondary | ICD-10-CM

## 2018-05-24 DIAGNOSIS — G2581 Restless legs syndrome: Secondary | ICD-10-CM

## 2018-05-28 ENCOUNTER — Telehealth: Payer: Self-pay

## 2018-05-28 NOTE — Telephone Encounter (Signed)
Pt has returned the call to RN Kristen, she is asking for a call back °

## 2018-05-28 NOTE — Telephone Encounter (Signed)
I called pt to discuss her sleep study results. No answer, left a message asking her to call me back. 

## 2018-05-28 NOTE — Telephone Encounter (Signed)
-----   Message from Star Age, MD sent at 05/28/2018  7:59 AM EDT ----- Patient referred by Dr. Bronson Ing, her cardiologist, seen by me on 04/30/18, diagnostic PSG on 05/24/18.   Please call and notify the patient that the recent sleep study showed overall mild obstructive sleep apnea, severe during REM sleep, with a total AHI of 10.5/hour, REM AHI of 33.7/hour, and O2 nadir of 80%. Given the patient's medical history I recommend treatment in the form of CPAP. We will work with her to overcome prior issues with claustrophobia; she will require a repeat sleep study for proper titration and mask fitting and correct monitoring of the oxygen saturations. Please explain to patient. I have placed an order in the chart. Thanks.  Star Age, MD, PhD [facility])

## 2018-05-28 NOTE — Progress Notes (Signed)
Patient referred by Dr. Bronson Ing, her cardiologist, seen by me on 04/30/18, diagnostic PSG on 05/24/18.   Please call and notify the patient that the recent sleep study showed overall mild obstructive sleep apnea, severe during REM sleep, with a total AHI of 10.5/hour, REM AHI of 33.7/hour, and O2 nadir of 80%. Given the patient's medical history I recommend treatment in the form of CPAP. We will work with her to overcome prior issues with claustrophobia; she will require a repeat sleep study for proper titration and mask fitting and correct monitoring of the oxygen saturations. Please explain to patient. I have placed an order in the chart. Thanks.  Star Age, MD, PhD [facility])

## 2018-05-28 NOTE — Addendum Note (Signed)
Addended by: Star Age on: 05/28/2018 07:59 AM   Modules accepted: Orders

## 2018-05-28 NOTE — Procedures (Signed)
PATIENT'S NAME:  [name], [name] DOB:      1946/07/18      MR#:    [id number]     DATE OF RECORDING: 05/24/2018 REFERRING M.D.:  Kate Sable, MD Study Performed:   Baseline Polysomnogram HISTORY; 72 year old woman with a history of hypertension, chronic diastolic congestive heart failure, hyperlipidemia, type 2 diabetes, hypothyroidism, reflux disease, diverticulosis and overweight state, who was previously diagnosed with obstructive sleep apnea and placed on CPAP therapy, but could not tolerate CPAP at the time. The patient endorsed the Epworth Sleepiness Scale at 10/24 points. The patient's weight 185 pounds with a height of 67 (inches), resulting in a BMI of 29.1 kg/m2. The patient's neck circumference measured 15.5 inches.  CURRENT MEDICATIONS: Xanax, Vitamin D, Nexium, Glucotrol, Synthroid, Glucophage, Zocor, Cozaar.   PROCEDURE:  This is a multichannel digital polysomnogram utilizing the Somnostar 11.2 system.  Electrodes and sensors were applied and monitored per AASM Specifications.   EEG, EOG, Chin and Limb EMG, were sampled at 200 Hz.  ECG, Snore and Nasal Pressure, Thermal Airflow, Respiratory Effort, CPAP Flow and Pressure, Oximetry was sampled at 50 Hz. Digital video and audio were recorded.      BASELINE STUDY  Lights Out was at 22:11 and Lights On at 05:11. Total recording time (TRT) was 420 minutes, with a total sleep time (TST) of  354 minutes.   The patient's sleep latency was 36 minutes, which is mildly delayed. REM latency was 87.5 minutes, which is normal. The sleep efficiency was 84.3 %.     SLEEP ARCHITECTURE: WASO (Wake after sleep onset) was 29 minutes with mild sleep fragmentation noted. There were 10.5 minutes in Stage N1, 249 minutes Stage N2, 0 minutes Stage N3 and 94.5 minutes in Stage REM.  The percentage of Stage N1 was 3.%, Stage N2 was 70.3%, which is markedly increased, Stage N3 was absent, and Stage R (REM sleep) was 26.7%, which is mildly increased. The arousals  were noted as: 17 were spontaneous, 0 were associated with PLMs, 4 were associated with respiratory events.  RESPIRATORY ANALYSIS:  There were a total of 62 respiratory events:  26 obstructive apneas, 1 central apneas and 0 mixed apneas with a total of 27 apneas and an apnea index (AI) of 4.6 /hour. There were 35 hypopneas with a hypopnea index of 5.9 /hour. The patient also had 0 respiratory event related arousals (RERAs).      The total APNEA/HYPOPNEA INDEX (AHI) was 10.5/hour and the total RESPIRATORY DISTURBANCE INDEX was 0. 10.5 /hour.  53 events occurred in REM sleep and 13 events in NREM. The REM AHI was 33.7/hour, versus a non-REM AHI of 2.1. The patient spent 27 minutes of total sleep time in the supine position and 327 minutes in non-supine. The supine AHI was 4.4 versus a non-supine AHI of 11.1.  OXYGEN SATURATION & C02:  The Wake baseline 02 saturation was 90%, with the lowest being 80%. Time spent below 89% saturation equaled 118 minutes.  PERIODIC LIMB MOVEMENTS: The patient had a total of 5 Periodic Limb Movements.  The Periodic Limb Movement (PLM) index was .8 and the PLM Arousal index was 0/hour.  Audio and video analysis did not show any abnormal or unusual movements, behaviors, phonations or vocalizations. The patient took 1 bathroom break. Mild intermittent snoring was noted. The EKG was in keeping with normal sinus rhythm (NSR).  Post-study, the patient indicated that sleep was worse than usual.   IMPRESSION: 1. Obstructive Sleep Apnea (OSA) 2. Dysfunctions  associated with Sleep stages or Arousals from Sleep  RECOMMENDATIONS: 1. This study demonstrates overall mild obstructive sleep apnea, severe during REM sleep, with a total AHI of 10.5/hour, REM AHI of 33.7/hour, and O2 nadir of 80%. Given the patient's medical history and sleep related complaints, CPAP therapy is a reasonable treatment option; a full-night CPAP titration study is recommended to optimize therapy. Other  treatment options may include avoidance of supine sleep position along with weight loss, upper airway or jaw surgery in selected patients or the use of an oral appliance in certain patients. ENT evaluation and/or consultation with a maxillofacial surgeon or dentist may be feasible in some instances.    2. Please note that untreated obstructive sleep apnea may carry additional perioperative morbidity. Patients with significant obstructive sleep apnea (typically, in the moderate to severe degree) should receive, if possible, perioperative PAP (positive airway pressure) therapy and the surgeons and particularly the anesthesiologists should be informed of the diagnosis and the severity of the sleep disordered breathing. If feasible, the patient may be asked to bring his/her CPAP or BiPAP machine for a planned/elective surgery.  3. This study shows sleep fragmentation and abnormal sleep stage percentages; these are nonspecific findings and per se do not signify an intrinsic sleep disorder or a cause for the patient's sleep-related symptoms. Causes include (but are not limited to) the first night effect of the sleep study, circadian rhythm disturbances, medication effect or an underlying mood disorder or medical problem.  4. The patient should be cautioned not to drive, work at heights, or operate dangerous or heavy equipment when tired or sleepy. Review and reiteration of good sleep hygiene measures should be pursued with any patient. 5. The patient will be seen in follow-up in the sleep clinic at [facility] for discussion of the test results, symptom and treatment compliance review, further management strategies, etc. The referring provider will be notified of the test results.  I certify that I have reviewed the entire raw data recording prior to the issuance of this report in accordance with the Standards of Accreditation of the American Academy of Sleep Medicine (AASM)   Star Age, MD, PhD Diplomat, American  Board of Psychiatry and Neurology ([place]

## 2018-05-28 NOTE — Telephone Encounter (Signed)
I called pt. No answer, left a message asking her to call me back.

## 2018-05-29 NOTE — Telephone Encounter (Signed)
Pt returned Rn's call °

## 2018-05-29 NOTE — Telephone Encounter (Signed)
I called pt. I advised pt that Dr. Rexene Alberts reviewed their sleep study results and found that pt has mild osa, severe in REM sleep, with an O2 nadir of 80% and recommends that pt be treated with a cpap. Dr. Rexene Alberts recommends that pt return for a repeat sleep study in order to properly titrate the cpap and ensure a good mask fit.We will also work with pt to get used to the cpap, including her claustrophobia issues. Pt is agreeable to returning for a titration study. I advised pt that our sleep lab will file with pt's insurance and call pt to schedule the sleep study when we hear back from the pt's insurance regarding coverage of this sleep study. Pt verbalized understanding of results. Pt had no questions at this time but was encouraged to call back if questions arise.

## 2018-06-05 ENCOUNTER — Encounter (HOSPITAL_COMMUNITY)
Admission: RE | Admit: 2018-06-05 | Discharge: 2018-06-05 | Disposition: A | Discharge status: Home / Self Care | Payer: Medicare Other | Source: Ambulatory Visit | Attending: Internal Medicine | Admitting: Internal Medicine

## 2018-06-05 DIAGNOSIS — M810 Age-related osteoporosis without current pathological fracture: Secondary | ICD-10-CM | POA: Insufficient documentation

## 2018-06-05 MED ORDER — DENOSUMAB 60 MG/ML ~~LOC~~ SOSY
60.0000 mg | PREFILLED_SYRINGE | Freq: Once | SUBCUTANEOUS | Status: AC
  Administered 2018-06-05: 60 mg via SUBCUTANEOUS
  Filled 2018-06-05: qty 1

## 2018-06-11 DIAGNOSIS — E785 Hyperlipidemia, unspecified: Secondary | ICD-10-CM | POA: Diagnosis not present

## 2018-06-11 DIAGNOSIS — E1165 Type 2 diabetes mellitus with hyperglycemia: Secondary | ICD-10-CM | POA: Diagnosis not present

## 2018-06-11 DIAGNOSIS — D519 Vitamin B12 deficiency anemia, unspecified: Secondary | ICD-10-CM | POA: Diagnosis not present

## 2018-06-11 DIAGNOSIS — E039 Hypothyroidism, unspecified: Secondary | ICD-10-CM | POA: Diagnosis not present

## 2018-06-11 DIAGNOSIS — E559 Vitamin D deficiency, unspecified: Secondary | ICD-10-CM | POA: Diagnosis not present

## 2018-06-13 DIAGNOSIS — M810 Age-related osteoporosis without current pathological fracture: Secondary | ICD-10-CM | POA: Diagnosis not present

## 2018-06-13 DIAGNOSIS — E039 Hypothyroidism, unspecified: Secondary | ICD-10-CM | POA: Diagnosis not present

## 2018-06-13 DIAGNOSIS — F331 Major depressive disorder, recurrent, moderate: Secondary | ICD-10-CM | POA: Diagnosis not present

## 2018-06-13 DIAGNOSIS — E1165 Type 2 diabetes mellitus with hyperglycemia: Secondary | ICD-10-CM | POA: Diagnosis not present

## 2018-06-13 DIAGNOSIS — E782 Mixed hyperlipidemia: Secondary | ICD-10-CM | POA: Diagnosis not present

## 2018-06-13 DIAGNOSIS — Z6829 Body mass index (BMI) 29.0-29.9, adult: Secondary | ICD-10-CM | POA: Diagnosis not present

## 2018-06-13 DIAGNOSIS — I10 Essential (primary) hypertension: Secondary | ICD-10-CM | POA: Diagnosis not present

## 2018-06-13 DIAGNOSIS — E559 Vitamin D deficiency, unspecified: Secondary | ICD-10-CM | POA: Diagnosis not present

## 2018-07-09 ENCOUNTER — Ambulatory Visit (INDEPENDENT_AMBULATORY_CARE_PROVIDER_SITE_OTHER): Payer: Medicare Other | Admitting: Neurology

## 2018-07-09 DIAGNOSIS — I5032 Chronic diastolic (congestive) heart failure: Secondary | ICD-10-CM

## 2018-07-09 DIAGNOSIS — G2581 Restless legs syndrome: Secondary | ICD-10-CM

## 2018-07-09 DIAGNOSIS — R351 Nocturia: Secondary | ICD-10-CM

## 2018-07-09 DIAGNOSIS — R51 Headache: Secondary | ICD-10-CM

## 2018-07-09 DIAGNOSIS — G4733 Obstructive sleep apnea (adult) (pediatric): Secondary | ICD-10-CM | POA: Diagnosis not present

## 2018-07-09 DIAGNOSIS — G478 Other sleep disorders: Secondary | ICD-10-CM

## 2018-07-09 DIAGNOSIS — G4719 Other hypersomnia: Secondary | ICD-10-CM

## 2018-07-09 DIAGNOSIS — G4720 Circadian rhythm sleep disorder, unspecified type: Secondary | ICD-10-CM

## 2018-07-09 DIAGNOSIS — R519 Headache, unspecified: Secondary | ICD-10-CM

## 2018-07-10 NOTE — Procedures (Signed)
PATIENT'S NAME:  [name], [name] DOB:      03-Mar-1946      MR#:    [id number]     DATE OF RECORDING: 07/09/2018 REFERRING M.D.:  Kate Sable, MD Study Performed:   CPAP  Titration HISTORY: 72 year old woman with a history of hypertension, chronic diastolic congestive heart failure, hyperlipidemia, type 2 diabetes, hypothyroidism, reflux disease, diverticulosis and overweight state, who returns for a full night PAP titration. Her baseline PSG from 05/24/2018 showed a total AHI of 10.5 and low spo2 of 80%. The patient endorsed the Epworth Sleepiness Scale at 10 points, BMI of 29.1 kg/m2. The patient's neck circumference measured 15.5 inches.  CURRENT MEDICATIONS: Xanax, Vitamin D, Nexium, Glucotrol, Synthroid, Glucophage, Zocor, Cozaar.  PROCEDURE:  This is a multichannel digital polysomnogram utilizing the SomnoStar 11.2 system.  Electrodes and sensors were applied and monitored per AASM Specifications.   EEG, EOG, Chin and Limb EMG, were sampled at 200 Hz.  ECG, Snore and Nasal Pressure, Thermal Airflow, Respiratory Effort, CPAP Flow and Pressure, Oximetry was sampled at 50 Hz. Digital video and audio were recorded.      The patient was fitted with a small Dreamwear nasal mask. CPAP was initiated at 5 cmH20 with heated humidity per AASM standards and pressure was advanced to 10/10cmH20 because of hypopneas, apneas and desaturations.  At a PAP pressure of 10 cmH20, there was a reduction of the AHI to 0/hour with non-supine REM sleep achieved and O2 nadir of 86%.  Lights Out was at 22:42 and Lights On at 05:11. Total recording time (TRT) was 390 minutes, with a total sleep time (TST) of 260.5 minutes. The patient's sleep latency was 11.5 minutes. REM latency was 50.5 minutes, which is mildly reduced. The sleep efficiency was 66.8 %.    SLEEP ARCHITECTURE: WASO (Wake after sleep onset)  was 114.5 minutes with moderate to severe sleep fragmentation noted.  There were 30 minutes in Stage N1, 83 minutes  Stage N2, 80 minutes Stage N3 and 67.5 minutes in Stage REM.  The percentage of Stage N1 was 11.5%, which is increased, Stage N2 was 31.9%, Stage N3 was 30.7%, which is increased, and Stage R (REM sleep) was 25.9%, which is high normal. The arousals were noted as: 76 were spontaneous, 0 were associated with PLMs, 0 were associated with respiratory events.  RESPIRATORY ANALYSIS:  There was a total of 8 respiratory events: 0 obstructive apneas, 0 central apneas and 0 mixed apneas with a total of 0 apneas and an apnea index (AI) of 0 /hour. There were 8 hypopneas with a hypopnea index of 1.8/hour. The patient also had 0 respiratory event related arousals (RERAs).      The total APNEA/HYPOPNEA INDEX  (AHI) was 1.8/hour and the total RESPIRATORY DISTURBANCE INDEX was 1.8 0./hour  8 events occurred in REM sleep and 0 events in NREM. The REM AHI was 7.1 /hour versus a non-REM AHI of 0 0./hour.  The patient spent 0 minutes of total sleep time in the supine position and 261 minutes in non-supine. The supine AHI was 0.0, versus a non-supine AHI of 1.8.  OXYGEN SATURATION & C02:  The baseline 02 saturation was 93%, with the lowest being 84%. Time spent below 89% saturation equaled 29 minutes.  PERIODIC LIMB MOVEMENTS:  The patient had a total of 0 Periodic Limb Movements. The Periodic Limb Movement (PLM) index was 0 and the PLM Arousal index was 0 /hour.  Audio and video analysis did not show any abnormal or  unusual movements, behaviors, phonations or vocalizations. The patient took no bathroom breaks. The EKG was in keeping with normal sinus rhythm (NSR).  Post-study, the patient indicated that sleep was the same as usual.   IMPRESSION: 1. Obstructive Sleep Apnea (OSA) 2. Dysfunctions associated with Sleep stages or Arousals from Sleep   RECOMMENDATIONS: 1. This study demonstrates resolution of the patient's obstructive sleep apnea with CPAP therapy. I will, therefore, start the patient on home CPAP  treatment at a pressure of 10 cm via small nasal mask with heated humidity. The patient should be reminded to be fully compliant with PAP therapy to improve sleep related symptoms and decrease long term cardiovascular risks. The patient should be reminded, that it may take up to 3 months to get fully used to using PAP with all planned sleep. The earlier full compliance is achieved, the better long term compliance tends to be. 2. Please note that untreated obstructive sleep apnea may carry additional perioperative morbidity. Patients with significant obstructive sleep apnea (typically, in the moderate to severe degree) should receive, if possible, perioperative PAP (positive airway pressure) therapy and the surgeons and particularly the anesthesiologists should be informed of the diagnosis and the severity of the sleep disordered breathing. If feasible, the patient may be asked to bring his/her CPAP or BiPAP machine for a planned/elective surgery.  3. This study shows sleep fragmentation and abnormal sleep stage percentages; these are nonspecific findings and per se do not signify an intrinsic sleep disorder or a cause for the patient's sleep-related symptoms. Causes include (but are not limited to) the first night effect of the sleep study, circadian rhythm disturbances, medication effect or an underlying mood disorder or medical problem.  4. The patient should be cautioned not to drive, work at heights, or operate dangerous or heavy equipment when tired or sleepy. Review and reiteration of good sleep hygiene measures should be pursued with any patient. 5. The patient will be seen in follow-up in the sleep clinic at [facility] for discussion of the test results, symptom and treatment compliance review, further management strategies, etc. The referring provider will be notified of the test results.   I certify that I have reviewed the entire raw data recording prior to the issuance of this report in accordance with  the Standards of Accreditation of the American Academy of Sleep Medicine (AASM)  Star Age, MD, PhD Diplomat, American Board of Neurology and Sleep Medicine (Neurology and Sleep Medicine)

## 2018-07-10 NOTE — Progress Notes (Signed)
Patient referred by Dr. Bronson Ing, her cardiologist, seen by me on 04/30/18, diagnostic PSG on 05/24/18. Patient had a CPAP titration study on 07/09/18.  Please call and inform patient that I have entered an order for treatment with positive airway pressure (PAP) treatment for obstructive sleep apnea (OSA). She did fairly well during the latest sleep study with CPAP. We will, therefore, arrange for a machine for home use through a DME (durable medical equipment) company of Her choice; and I will see the patient back in follow-up in about 10 weeks. Please also explain to the patient that I will be looking out for compliance data, which can be downloaded from the machine (stored on an SD card, that is inserted in the machine) or via remote access through a modem, that is built into the machine. At the time of the followup appointment we will discuss sleep study results and how it is going with PAP treatment at home. Please advise patient to bring Her machine at the time of the first FU visit, even though this is cumbersome. Bringing the machine for every visit after that will likely not be needed, but often helps for the first visit to troubleshoot if needed. Please re-enforce the importance of compliance with treatment and the need for [place] to monitor compliance data - often an insurance requirement and actually good feedback for the patient as far as how they are doing.  Also remind patient, that any interim PAP machine or mask issues should be first addressed with the DME company, as they can often help better with technical and mask fit issues. Please ask if patient has a preference regarding DME company.  Please also make sure, the patient has a follow-up appointment with me in about 10 weeks from the setup date, thanks. May see one of our nurse practitioners if needed for proper timing of the FU appointment.  Please fax or rout report to the referring provider. Thanks,   Star Age, MD, PhD [facility])

## 2018-07-10 NOTE — Addendum Note (Signed)
Addended by: Star Age on: 07/10/2018 06:20 PM   Modules accepted: Orders

## 2018-07-11 ENCOUNTER — Telehealth: Payer: Self-pay

## 2018-07-11 NOTE — Telephone Encounter (Signed)
-----   Message from Star Age, MD sent at 07/10/2018  6:20 PM EDT ----- Patient referred by Dr. Bronson Ing, her cardiologist, seen by me on 04/30/18, diagnostic PSG on 05/24/18. Patient had a CPAP titration study on 07/09/18.  Please call and inform patient that I have entered an order for treatment with positive airway pressure (PAP) treatment for obstructive sleep apnea (OSA). She did fairly well during the latest sleep study with CPAP. We will, therefore, arrange for a machine for home use through a DME (durable medical equipment) company of Her choice; and I will see the patient back in follow-up in about 10 weeks. Please also explain to the patient that I will be looking out for compliance data, which can be downloaded from the machine (stored on an SD card, that is inserted in the machine) or via remote access through a modem, that is built into the machine. At the time of the followup appointment we will discuss sleep study results and how it is going with PAP treatment at home. Please advise patient to bring Her machine at the time of the first FU visit, even though this is cumbersome. Bringing the machine for every visit after that will likely not be needed, but often helps for the first visit to troubleshoot if needed. Please re-enforce the importance of compliance with treatment and the need for [place] to monitor compliance data - often an insurance requirement and actually good feedback for the patient as far as how they are doing.  Also remind patient, that any interim PAP machine or mask issues should be first addressed with the DME company, as they can often help better with technical and mask fit issues. Please ask if patient has a preference regarding DME company.  Please also make sure, the patient has a follow-up appointment with me in about 10 weeks from the setup date, thanks. May see one of our nurse practitioners if needed for proper timing of the FU appointment.  Please fax or rout report to  the referring provider. Thanks,   Star Age, MD, PhD [facility])

## 2018-07-11 NOTE — Telephone Encounter (Addendum)
I called pt. I advised pt that Dr. Rexene Alberts reviewed their sleep study results and found that pt did fairly well with the cpap during her latest sleep study. Dr. Rexene Alberts recommends that pt start a cpap at home. I reviewed PAP compliance expectations with the pt. Pt is agreeable to starting a CPAP. I advised pt that an order will be sent to a DME, [employer], and [employer] will call the pt within about one week after they file with the pt's insurance. [employer] will show the pt how to use the machine, fit for masks, and troubleshoot the CPAP if needed. A follow up appt was made for insurance purposes with Dr. Rexene Alberts on 09/25/18 at 9:30am. Pt verbalized understanding to arrive 15 minutes early and bring their CPAP. A letter with all of this information in it will be mailed to the pt as a reminder. I verified with the pt that the address we have on file is correct. Pt verbalized understanding of results. Pt had no questions at this time but was encouraged to call back if questions arise.

## 2018-07-19 NOTE — Telephone Encounter (Signed)
Received this notice from [employer]:   "This is a patient of Dr. Tori Milks. I received the order on 07/11/2018. I called on 07/12/2018 to let her know I have her order and will be calling back to go over financials and scheduling. I called the patient this morning to get her scheduled, she has Medicare and [employer] so I let her know she will have to meet compliance by using her machine at least 4 hours a night, she immediately cut me off and said she will never be able to use the machine that long, she would much rather go without doing all of this and hung up.

## 2018-07-19 NOTE — Telephone Encounter (Signed)
I called pt. Pt reports that she has no interest in starting cpap and "am tired of messing with these things." She told me that she does not want an appt to discuss alternative treatments for her osa, and asked me to cancel the October appt. I explained to her the potential risks and ramifications of untreated osa. Pt verbalized understanding. Appt has been cancelled.

## 2018-07-19 NOTE — Telephone Encounter (Signed)
Please offer FU appt to d/w me or NP alternative treatment options for OSA.

## 2018-08-13 ENCOUNTER — Other Ambulatory Visit (HOSPITAL_COMMUNITY): Payer: Self-pay | Admitting: Internal Medicine

## 2018-08-13 DIAGNOSIS — Z1231 Encounter for screening mammogram for malignant neoplasm of breast: Secondary | ICD-10-CM

## 2018-08-20 ENCOUNTER — Ambulatory Visit (HOSPITAL_COMMUNITY): Payer: Medicare Other

## 2018-08-22 ENCOUNTER — Ambulatory Visit (HOSPITAL_COMMUNITY)
Admission: RE | Admit: 2018-08-22 | Discharge: 2018-08-22 | Disposition: A | Discharge status: Home / Self Care | Payer: Medicare Other | Source: Ambulatory Visit | Attending: Internal Medicine | Admitting: Internal Medicine

## 2018-08-22 DIAGNOSIS — Z1231 Encounter for screening mammogram for malignant neoplasm of breast: Secondary | ICD-10-CM | POA: Diagnosis not present

## 2018-09-12 DIAGNOSIS — E1165 Type 2 diabetes mellitus with hyperglycemia: Secondary | ICD-10-CM | POA: Diagnosis not present

## 2018-09-12 DIAGNOSIS — F331 Major depressive disorder, recurrent, moderate: Secondary | ICD-10-CM | POA: Diagnosis not present

## 2018-09-12 DIAGNOSIS — E785 Hyperlipidemia, unspecified: Secondary | ICD-10-CM | POA: Diagnosis not present

## 2018-09-12 DIAGNOSIS — I10 Essential (primary) hypertension: Secondary | ICD-10-CM | POA: Diagnosis not present

## 2018-09-12 DIAGNOSIS — E039 Hypothyroidism, unspecified: Secondary | ICD-10-CM | POA: Diagnosis not present

## 2018-09-12 DIAGNOSIS — E782 Mixed hyperlipidemia: Secondary | ICD-10-CM | POA: Diagnosis not present

## 2018-09-12 DIAGNOSIS — Z6829 Body mass index (BMI) 29.0-29.9, adult: Secondary | ICD-10-CM | POA: Diagnosis not present

## 2018-09-12 DIAGNOSIS — M810 Age-related osteoporosis without current pathological fracture: Secondary | ICD-10-CM | POA: Diagnosis not present

## 2018-09-12 DIAGNOSIS — E559 Vitamin D deficiency, unspecified: Secondary | ICD-10-CM | POA: Diagnosis not present

## 2018-09-17 DIAGNOSIS — N183 Chronic kidney disease, stage 3 (moderate): Secondary | ICD-10-CM | POA: Diagnosis not present

## 2018-09-17 DIAGNOSIS — F331 Major depressive disorder, recurrent, moderate: Secondary | ICD-10-CM | POA: Diagnosis not present

## 2018-09-17 DIAGNOSIS — E039 Hypothyroidism, unspecified: Secondary | ICD-10-CM | POA: Diagnosis not present

## 2018-09-17 DIAGNOSIS — E782 Mixed hyperlipidemia: Secondary | ICD-10-CM | POA: Diagnosis not present

## 2018-09-17 DIAGNOSIS — I1310 Hypertensive heart and chronic kidney disease without heart failure, with stage 1 through stage 4 chronic kidney disease, or unspecified chronic kidney disease: Secondary | ICD-10-CM | POA: Diagnosis not present

## 2018-09-17 DIAGNOSIS — Z0000 Encounter for general adult medical examination without abnormal findings: Secondary | ICD-10-CM | POA: Diagnosis not present

## 2018-09-17 DIAGNOSIS — E1165 Type 2 diabetes mellitus with hyperglycemia: Secondary | ICD-10-CM | POA: Diagnosis not present

## 2018-09-17 DIAGNOSIS — M810 Age-related osteoporosis without current pathological fracture: Secondary | ICD-10-CM | POA: Diagnosis not present

## 2018-09-17 DIAGNOSIS — E559 Vitamin D deficiency, unspecified: Secondary | ICD-10-CM | POA: Diagnosis not present

## 2018-09-25 ENCOUNTER — Ambulatory Visit: Payer: Self-pay | Admitting: Neurology

## 2018-10-04 DIAGNOSIS — Z23 Encounter for immunization: Secondary | ICD-10-CM | POA: Diagnosis not present

## 2018-10-12 DIAGNOSIS — Z961 Presence of intraocular lens: Secondary | ICD-10-CM | POA: Diagnosis not present

## 2018-10-12 DIAGNOSIS — E119 Type 2 diabetes mellitus without complications: Secondary | ICD-10-CM | POA: Diagnosis not present

## 2018-10-12 DIAGNOSIS — Z7984 Long term (current) use of oral hypoglycemic drugs: Secondary | ICD-10-CM | POA: Diagnosis not present

## 2018-12-05 ENCOUNTER — Encounter (HOSPITAL_COMMUNITY): Admission: RE | Admit: 2018-12-05 | Payer: Medicare Other | Source: Ambulatory Visit

## 2018-12-05 ENCOUNTER — Encounter (HOSPITAL_COMMUNITY): Payer: Medicare Other

## 2018-12-10 ENCOUNTER — Encounter (HOSPITAL_COMMUNITY)
Admission: RE | Admit: 2018-12-10 | Discharge: 2018-12-10 | Disposition: A | Discharge status: Home / Self Care | Payer: Medicare Other | Source: Ambulatory Visit | Attending: Internal Medicine | Admitting: Internal Medicine

## 2018-12-10 DIAGNOSIS — M810 Age-related osteoporosis without current pathological fracture: Secondary | ICD-10-CM | POA: Insufficient documentation

## 2018-12-10 MED ORDER — DENOSUMAB 60 MG/ML ~~LOC~~ SOSY
60.0000 mg | PREFILLED_SYRINGE | Freq: Once | SUBCUTANEOUS | Status: AC
  Administered 2018-12-10: 60 mg via SUBCUTANEOUS

## 2018-12-10 MED ORDER — DENOSUMAB 60 MG/ML ~~LOC~~ SOSY
PREFILLED_SYRINGE | SUBCUTANEOUS | Status: AC
  Filled 2018-12-10: qty 1

## 2019-01-15 DIAGNOSIS — E039 Hypothyroidism, unspecified: Secondary | ICD-10-CM | POA: Diagnosis not present

## 2019-01-15 DIAGNOSIS — I10 Essential (primary) hypertension: Secondary | ICD-10-CM | POA: Diagnosis not present

## 2019-01-15 DIAGNOSIS — E785 Hyperlipidemia, unspecified: Secondary | ICD-10-CM | POA: Diagnosis not present

## 2019-01-15 DIAGNOSIS — M810 Age-related osteoporosis without current pathological fracture: Secondary | ICD-10-CM | POA: Diagnosis not present

## 2019-01-15 DIAGNOSIS — E782 Mixed hyperlipidemia: Secondary | ICD-10-CM | POA: Diagnosis not present

## 2019-01-15 DIAGNOSIS — E1165 Type 2 diabetes mellitus with hyperglycemia: Secondary | ICD-10-CM | POA: Diagnosis not present

## 2019-01-15 DIAGNOSIS — E559 Vitamin D deficiency, unspecified: Secondary | ICD-10-CM | POA: Diagnosis not present

## 2019-01-22 DIAGNOSIS — E1165 Type 2 diabetes mellitus with hyperglycemia: Secondary | ICD-10-CM | POA: Diagnosis not present

## 2019-01-22 DIAGNOSIS — E1122 Type 2 diabetes mellitus with diabetic chronic kidney disease: Secondary | ICD-10-CM | POA: Diagnosis not present

## 2019-01-22 DIAGNOSIS — F331 Major depressive disorder, recurrent, moderate: Secondary | ICD-10-CM | POA: Diagnosis not present

## 2019-01-22 DIAGNOSIS — J019 Acute sinusitis, unspecified: Secondary | ICD-10-CM | POA: Diagnosis not present

## 2019-01-22 DIAGNOSIS — E039 Hypothyroidism, unspecified: Secondary | ICD-10-CM | POA: Diagnosis not present

## 2019-01-22 DIAGNOSIS — M810 Age-related osteoporosis without current pathological fracture: Secondary | ICD-10-CM | POA: Diagnosis not present

## 2019-01-22 DIAGNOSIS — E559 Vitamin D deficiency, unspecified: Secondary | ICD-10-CM | POA: Diagnosis not present

## 2019-01-22 DIAGNOSIS — I10 Essential (primary) hypertension: Secondary | ICD-10-CM | POA: Diagnosis not present

## 2019-01-22 DIAGNOSIS — N183 Chronic kidney disease, stage 3 (moderate): Secondary | ICD-10-CM | POA: Diagnosis not present

## 2019-01-22 DIAGNOSIS — E782 Mixed hyperlipidemia: Secondary | ICD-10-CM | POA: Diagnosis not present

## 2019-02-18 DIAGNOSIS — F331 Major depressive disorder, recurrent, moderate: Secondary | ICD-10-CM | POA: Diagnosis not present

## 2019-02-18 DIAGNOSIS — F411 Generalized anxiety disorder: Secondary | ICD-10-CM | POA: Diagnosis not present

## 2019-02-18 DIAGNOSIS — R197 Diarrhea, unspecified: Secondary | ICD-10-CM | POA: Diagnosis not present

## 2019-03-04 ENCOUNTER — Ambulatory Visit (INDEPENDENT_AMBULATORY_CARE_PROVIDER_SITE_OTHER): Payer: Medicare Other | Admitting: Cardiovascular Disease

## 2019-03-04 ENCOUNTER — Encounter: Payer: Self-pay | Admitting: Cardiovascular Disease

## 2019-03-04 VITALS — BP 148/88 | HR 73 | Ht 67.0 in | Wt 183.0 lb

## 2019-03-04 DIAGNOSIS — G4733 Obstructive sleep apnea (adult) (pediatric): Secondary | ICD-10-CM

## 2019-03-04 DIAGNOSIS — I5032 Chronic diastolic (congestive) heart failure: Secondary | ICD-10-CM | POA: Diagnosis not present

## 2019-03-04 DIAGNOSIS — I10 Essential (primary) hypertension: Secondary | ICD-10-CM | POA: Diagnosis not present

## 2019-03-04 DIAGNOSIS — E119 Type 2 diabetes mellitus without complications: Secondary | ICD-10-CM

## 2019-03-04 DIAGNOSIS — R0609 Other forms of dyspnea: Secondary | ICD-10-CM

## 2019-03-04 DIAGNOSIS — E7800 Pure hypercholesterolemia, unspecified: Secondary | ICD-10-CM | POA: Diagnosis not present

## 2019-03-04 MED ORDER — LOSARTAN POTASSIUM 50 MG PO TABS: 50.0000 mg | ORAL_TABLET | Freq: Every day | ORAL | 3 refills | Status: AC

## 2019-03-04 MED ORDER — LOSARTAN POTASSIUM 50 MG PO TABS: 50.0000 mg | ORAL_TABLET | Freq: Every day | ORAL | 3 refills | Status: DC

## 2019-03-04 NOTE — Patient Instructions (Signed)
Medication Instructions:   INCREASE Losartan to 50 mg daily  If you need a refill on your cardiac medications before your next appointment, please call your pharmacy.   Lab work: None today If you have labs (blood work) drawn today and your tests are completely normal, you will receive your results only by: Marland Kitchen MyChart Message (if you have MyChart) OR . A paper copy in the mail If you have any lab test that is abnormal or we need to change your treatment, we will call you to review the results.  Testing/Procedures: None today  Follow-Up: As needed with Dr.Koneswaran

## 2019-03-04 NOTE — Progress Notes (Signed)
SUBJECTIVE: The patient presents for routine follow-up.  She underwent coronary angiography which demonstrated normal coronary arteries on 08/22/17.  She had a mild elevation in end-diastolic pressure compatible with chronic diastolic heart failure.  There were failed attempts at right heart catheterization due to inability to access the venous system from either upper extremity.  She refused a femoral approach.  Echocardiogram on 07/05/17 demonstrated normal left ventricular systolic function, LVEF 99-24%, grade 1 diastolic dysfunction, with elevated ventricular filling pressures.  There was mild aortic and mitral regurgitation.  I prescribed Lasix for 3 days in August 2018 but symptoms did not improve.  She has significant sleep apnea but is intolerant of CPAP due to claustrophobia. She has chronic fatigue related to this.  ECG performed today demonstrates sinus rhythm with nonspecific T wave abnormalities.  She tells me that she is getting over a bad lung infection for the past month.  She was on antibiotics as prescribed by her PCP.  She had been short of breath with minimal exertion but this is gradually improving.  She has occasional upper left-sided chest pressure which radiates into her shoulder.  This occurs sporadically.  She is going back to see her PCP in the near future.  She is bothered by secondhand smoke from her fianc who sees Dr. Domenic Polite.  She has not gotten over the death of her youngest daughter who died at the age of 9 from cancer.    Review of Systems: As per "subjective", otherwise negative.  No Known Allergies  Current Outpatient Medications  Medication Sig Dispense Refill  . ALPRAZolam (XANAX) 1 MG tablet Take 1 mg by mouth at bedtime as needed for anxiety.    . Cholecalciferol (VITAMIN D3) 5000 units TABS Take by mouth.    . esomeprazole (NEXIUM) 40 MG capsule Take 40 mg by mouth daily as needed (acid reflux).     Marland Kitchen levothyroxine (SYNTHROID,  LEVOTHROID) 75 MCG tablet Take 75 mcg by mouth daily before breakfast.    . losartan (COZAAR) 25 MG tablet Take 1 tablet (25 mg total) by mouth daily. 90 tablet 3  . metFORMIN (GLUCOPHAGE) 500 MG tablet Take 500 mg by mouth 2 (two) times daily.    . simvastatin (ZOCOR) 20 MG tablet Take 20 mg by mouth daily.     No current facility-administered medications for this visit.     Past Medical History:  Diagnosis Date  . Diverticulosis   . DM (diabetes mellitus) ([place])   . Esophageal stricture   . Gastritis   . GERD (gastroesophageal reflux disease)   . Heart valve problem   . Hypercholesterolemia    can't take meds, affects LFTs  . Hypothyroidism   . OSA on CPAP     Past Surgical History:  Procedure Laterality Date  . APPENDECTOMY    . CHOLECYSTECTOMY    . ESOPHAGOGASTRODUODENOSCOPY  09/28/11   mild gastritis/stricture in the distal esophagus/small gastric polyps benign  . LEFT HEART CATH AND CORONARY ANGIOGRAPHY N/A 08/22/2017   Procedure: LEFT HEART CATH AND CORONARY ANGIOGRAPHY;  Surgeon: Belva Crome, MD;  Location: [facility];  Service: Cardiovascular;  Laterality: N/A;  . THYROID SURGERY  2008   left  thyroid removed  . TOTAL ABDOMINAL HYSTERECTOMY    . TUBAL LIGATION    . YAG LASER APPLICATION Right 2/68/3419   Procedure: YAG LASER APPLICATION;  Surgeon: Rutherford Guys, MD;  Location: [facility];  Service: Ophthalmology;  Laterality: Right;  . YAG LASER  APPLICATION Left 7/82/9562   Procedure: YAG LASER APPLICATION;  Surgeon: Rutherford Guys, MD;  Location: [facility];  Service: Ophthalmology;  Laterality: Left;    Social History   Socioeconomic History  . Marital status: Widowed    Spouse name: Not on file  . Number of children: 3  . Years of education: Not on file  . Highest education level: Not on file  Occupational History  . Occupation: retired    Comment: Psychologist, prison and probation services   Social Needs  . Financial resource strain: Not on file  . Food insecurity:    Worry: Not on  file    Inability: Not on file  . Transportation needs:    Medical: Not on file    Non-medical: Not on file  Tobacco Use  . Smoking status: Never Smoker  . Smokeless tobacco: Never Used  Substance and Sexual Activity  . Alcohol use: No    Alcohol/week: 0.0 standard drinks  . Drug use: No  . Sexual activity: Not on file  Lifestyle  . Physical activity:    Days per week: Not on file    Minutes per session: Not on file  . Stress: Not on file  Relationships  . Social connections:    Talks on phone: Not on file    Gets together: Not on file    Attends religious service: Not on file    Active member of club or organization: Not on file    Attends meetings of clubs or organizations: Not on file    Relationship status: Not on file  . Intimate partner violence:    Fear of current or ex partner: Not on file    Emotionally abused: Not on file    Physically abused: Not on file    Forced sexual activity: Not on file  Other Topics Concern  . Not on file  Social History Narrative  . Not on file     Vitals:   03/04/19 1346  BP: (!) 148/88  Pulse: 73  SpO2: 93%  Weight: 183 lb (83 kg)  Height: 5\' 7"  (1.702 m)    Wt Readings from Last 3 Encounters:  03/04/19 183 lb (83 kg)  12/10/18 170 lb (77.1 kg)  04/30/18 185 lb (83.9 kg)     PHYSICAL EXAM General: NAD HEENT: Normal. Neck: No JVD, no thyromegaly. Lungs: Clear to auscultation bilaterally with normal respiratory effort. CV: Regular rate and rhythm, normal S1/S2, no S3/S4, no murmur. No pretibial or periankle edema.  No carotid bruit.   Abdomen: Soft, nontender, no distention.  Neurologic: Alert and oriented.  Psych: Normal affect. Skin: Normal. Musculoskeletal: No Jaffer deformities.    ECG: Reviewed above under Subjective   Labs: Lab Results  Component Value Date/Time   K 3.8 10/15/2017 12:46 PM   BUN 21 (H) 10/15/2017 12:46 PM   BUN 25 07/13/2017 08:40 AM   CREATININE 1.12 (H) 10/15/2017 12:46 PM    CREATININE 1.10 (H) 08/21/2017 09:35 AM   ALT 26 04/26/2014 12:20 PM   TSH 3.921 08/05/2017 09:31 AM   TSH 1.110 04/26/2014 12:20 PM   HGB 14.0 10/15/2017 12:46 PM     Lipids: Lab Results  Component Value Date/Time   LDLCALC 126 (H) 04/27/2014 06:16 AM   CHOL 215 (H) 04/27/2014 06:16 AM   TRIG 197 (H) 04/27/2014 06:16 AM   HDL 50 04/27/2014 06:16 AM       ASSESSMENT AND PLAN:  1.  Chronic diastolic heart failure: Cardiac catheterization and echocardiogram details noted  above. She does not require diuretics at this time.  I will aim to control blood pressure.  2.  Hypertension: Blood pressure is elevated.  She is on losartan 25 mg daily.    I will increase to 50 mg.  3.  Hyperlipidemia: Continue simvastatin 20 mg.  4.  Type 2 diabetes mellitus: Currently on metformin and glipizide.  5.  Sleep apnea: Follows with Dr. Rexene Alberts.  6.  Dyspnea on exertion: This is likely result from the healing process from what appears to have been a pulmonary infection.  She is worried about secondhand smoke from her fianc who refuses to quit smoking.  I told her that secondhand smoke will only delay the healing process as she tells me she has been wheezing from this.  He apparently refuses to quit smoking.   Disposition: Follow up with me as needed   Kate Sable, M.D., F.A.C.C.

## 2019-03-04 NOTE — Addendum Note (Signed)
Addended by: Barbarann Ehlers A on: 03/04/2019 02:46 PM   Modules accepted: Orders

## 2019-03-08 DIAGNOSIS — F331 Major depressive disorder, recurrent, moderate: Secondary | ICD-10-CM | POA: Diagnosis not present

## 2019-03-08 DIAGNOSIS — R490 Dysphonia: Secondary | ICD-10-CM | POA: Diagnosis not present

## 2019-03-08 DIAGNOSIS — F411 Generalized anxiety disorder: Secondary | ICD-10-CM | POA: Diagnosis not present

## 2019-06-10 ENCOUNTER — Encounter (HOSPITAL_COMMUNITY)
Admission: RE | Admit: 2019-06-10 | Discharge: 2019-06-10 | Disposition: A | Discharge status: Home / Self Care | Payer: Medicare Other | Source: Ambulatory Visit | Attending: Internal Medicine | Admitting: Internal Medicine

## 2019-06-10 ENCOUNTER — Encounter (HOSPITAL_COMMUNITY): Payer: Self-pay

## 2019-06-10 ENCOUNTER — Other Ambulatory Visit: Payer: Self-pay

## 2019-06-10 DIAGNOSIS — M810 Age-related osteoporosis without current pathological fracture: Secondary | ICD-10-CM | POA: Diagnosis not present

## 2019-06-10 MED ORDER — DENOSUMAB 60 MG/ML ~~LOC~~ SOSY
60.0000 mg | PREFILLED_SYRINGE | Freq: Once | SUBCUTANEOUS | Status: AC
  Administered 2019-06-10: 60 mg via SUBCUTANEOUS

## 2019-06-17 DIAGNOSIS — E559 Vitamin D deficiency, unspecified: Secondary | ICD-10-CM | POA: Diagnosis not present

## 2019-06-17 DIAGNOSIS — E039 Hypothyroidism, unspecified: Secondary | ICD-10-CM | POA: Diagnosis not present

## 2019-06-17 DIAGNOSIS — N183 Chronic kidney disease, stage 3 (moderate): Secondary | ICD-10-CM | POA: Diagnosis not present

## 2019-06-17 DIAGNOSIS — E785 Hyperlipidemia, unspecified: Secondary | ICD-10-CM | POA: Diagnosis not present

## 2019-06-17 DIAGNOSIS — E782 Mixed hyperlipidemia: Secondary | ICD-10-CM | POA: Diagnosis not present

## 2019-06-17 DIAGNOSIS — I10 Essential (primary) hypertension: Secondary | ICD-10-CM | POA: Diagnosis not present

## 2019-06-17 DIAGNOSIS — E1165 Type 2 diabetes mellitus with hyperglycemia: Secondary | ICD-10-CM | POA: Diagnosis not present

## 2019-06-21 DIAGNOSIS — E039 Hypothyroidism, unspecified: Secondary | ICD-10-CM | POA: Diagnosis not present

## 2019-06-21 DIAGNOSIS — M810 Age-related osteoporosis without current pathological fracture: Secondary | ICD-10-CM | POA: Diagnosis not present

## 2019-06-21 DIAGNOSIS — E782 Mixed hyperlipidemia: Secondary | ICD-10-CM | POA: Diagnosis not present

## 2019-06-21 DIAGNOSIS — E559 Vitamin D deficiency, unspecified: Secondary | ICD-10-CM | POA: Diagnosis not present

## 2019-06-21 DIAGNOSIS — E1165 Type 2 diabetes mellitus with hyperglycemia: Secondary | ICD-10-CM | POA: Diagnosis not present

## 2019-06-21 DIAGNOSIS — E1122 Type 2 diabetes mellitus with diabetic chronic kidney disease: Secondary | ICD-10-CM | POA: Diagnosis not present

## 2019-06-21 DIAGNOSIS — F331 Major depressive disorder, recurrent, moderate: Secondary | ICD-10-CM | POA: Diagnosis not present

## 2019-06-21 DIAGNOSIS — N183 Chronic kidney disease, stage 3 (moderate): Secondary | ICD-10-CM | POA: Diagnosis not present

## 2019-06-21 DIAGNOSIS — I129 Hypertensive chronic kidney disease with stage 1 through stage 4 chronic kidney disease, or unspecified chronic kidney disease: Secondary | ICD-10-CM | POA: Diagnosis not present

## 2019-06-24 ENCOUNTER — Other Ambulatory Visit: Payer: Self-pay | Admitting: Internal Medicine

## 2019-06-24 DIAGNOSIS — Z780 Asymptomatic menopausal state: Secondary | ICD-10-CM

## 2019-08-21 ENCOUNTER — Other Ambulatory Visit (HOSPITAL_COMMUNITY): Payer: Self-pay | Admitting: Internal Medicine

## 2019-08-21 DIAGNOSIS — Z1231 Encounter for screening mammogram for malignant neoplasm of breast: Secondary | ICD-10-CM

## 2019-08-28 ENCOUNTER — Ambulatory Visit (HOSPITAL_COMMUNITY)
Admission: RE | Admit: 2019-08-28 | Discharge: 2019-08-28 | Disposition: A | Discharge status: Home / Self Care | Payer: Medicare Other | Source: Ambulatory Visit | Attending: Internal Medicine | Admitting: Internal Medicine

## 2019-08-28 ENCOUNTER — Other Ambulatory Visit: Payer: Self-pay

## 2019-08-28 DIAGNOSIS — Z1231 Encounter for screening mammogram for malignant neoplasm of breast: Secondary | ICD-10-CM | POA: Insufficient documentation

## 2019-09-13 DIAGNOSIS — F331 Major depressive disorder, recurrent, moderate: Secondary | ICD-10-CM | POA: Diagnosis not present

## 2019-09-13 DIAGNOSIS — R490 Dysphonia: Secondary | ICD-10-CM | POA: Diagnosis not present

## 2019-09-13 DIAGNOSIS — E1165 Type 2 diabetes mellitus with hyperglycemia: Secondary | ICD-10-CM | POA: Diagnosis not present

## 2019-09-13 DIAGNOSIS — F411 Generalized anxiety disorder: Secondary | ICD-10-CM | POA: Diagnosis not present

## 2019-09-13 DIAGNOSIS — R197 Diarrhea, unspecified: Secondary | ICD-10-CM | POA: Diagnosis not present

## 2019-09-13 DIAGNOSIS — I1310 Hypertensive heart and chronic kidney disease without heart failure, with stage 1 through stage 4 chronic kidney disease, or unspecified chronic kidney disease: Secondary | ICD-10-CM | POA: Diagnosis not present

## 2019-09-13 DIAGNOSIS — E559 Vitamin D deficiency, unspecified: Secondary | ICD-10-CM | POA: Diagnosis not present

## 2019-09-13 DIAGNOSIS — M810 Age-related osteoporosis without current pathological fracture: Secondary | ICD-10-CM | POA: Diagnosis not present

## 2019-09-13 DIAGNOSIS — Z6829 Body mass index (BMI) 29.0-29.9, adult: Secondary | ICD-10-CM | POA: Diagnosis not present

## 2019-09-13 DIAGNOSIS — N183 Chronic kidney disease, stage 3 (moderate): Secondary | ICD-10-CM | POA: Diagnosis not present

## 2019-09-13 DIAGNOSIS — E782 Mixed hyperlipidemia: Secondary | ICD-10-CM | POA: Diagnosis not present

## 2019-09-13 DIAGNOSIS — I129 Hypertensive chronic kidney disease with stage 1 through stage 4 chronic kidney disease, or unspecified chronic kidney disease: Secondary | ICD-10-CM | POA: Diagnosis not present

## 2019-09-18 DIAGNOSIS — E039 Hypothyroidism, unspecified: Secondary | ICD-10-CM | POA: Diagnosis not present

## 2019-09-18 DIAGNOSIS — N183 Chronic kidney disease, stage 3 (moderate): Secondary | ICD-10-CM | POA: Diagnosis not present

## 2019-09-18 DIAGNOSIS — I129 Hypertensive chronic kidney disease with stage 1 through stage 4 chronic kidney disease, or unspecified chronic kidney disease: Secondary | ICD-10-CM | POA: Diagnosis not present

## 2019-09-18 DIAGNOSIS — R490 Dysphonia: Secondary | ICD-10-CM | POA: Diagnosis not present

## 2019-09-18 DIAGNOSIS — E1122 Type 2 diabetes mellitus with diabetic chronic kidney disease: Secondary | ICD-10-CM | POA: Diagnosis not present

## 2019-09-18 DIAGNOSIS — F411 Generalized anxiety disorder: Secondary | ICD-10-CM | POA: Diagnosis not present

## 2019-09-18 DIAGNOSIS — F331 Major depressive disorder, recurrent, moderate: Secondary | ICD-10-CM | POA: Diagnosis not present

## 2019-09-18 DIAGNOSIS — M810 Age-related osteoporosis without current pathological fracture: Secondary | ICD-10-CM | POA: Diagnosis not present

## 2019-09-18 DIAGNOSIS — E559 Vitamin D deficiency, unspecified: Secondary | ICD-10-CM | POA: Diagnosis not present

## 2019-09-18 DIAGNOSIS — E1165 Type 2 diabetes mellitus with hyperglycemia: Secondary | ICD-10-CM | POA: Diagnosis not present

## 2019-09-18 DIAGNOSIS — N3281 Overactive bladder: Secondary | ICD-10-CM | POA: Diagnosis not present

## 2019-09-18 DIAGNOSIS — I1310 Hypertensive heart and chronic kidney disease without heart failure, with stage 1 through stage 4 chronic kidney disease, or unspecified chronic kidney disease: Secondary | ICD-10-CM | POA: Diagnosis not present

## 2019-09-18 DIAGNOSIS — E782 Mixed hyperlipidemia: Secondary | ICD-10-CM | POA: Diagnosis not present

## 2019-09-18 DIAGNOSIS — Z6829 Body mass index (BMI) 29.0-29.9, adult: Secondary | ICD-10-CM | POA: Diagnosis not present

## 2019-09-18 DIAGNOSIS — R197 Diarrhea, unspecified: Secondary | ICD-10-CM | POA: Diagnosis not present

## 2019-10-03 DIAGNOSIS — Z23 Encounter for immunization: Secondary | ICD-10-CM | POA: Diagnosis not present

## 2019-10-21 DIAGNOSIS — Z961 Presence of intraocular lens: Secondary | ICD-10-CM | POA: Diagnosis not present

## 2019-10-21 DIAGNOSIS — E119 Type 2 diabetes mellitus without complications: Secondary | ICD-10-CM | POA: Diagnosis not present

## 2019-12-10 ENCOUNTER — Other Ambulatory Visit: Payer: Self-pay

## 2019-12-10 ENCOUNTER — Encounter (HOSPITAL_COMMUNITY)
Admission: RE | Admit: 2019-12-10 | Discharge: 2019-12-10 | Disposition: A | Discharge status: Home / Self Care | Payer: Medicare Other | Source: Ambulatory Visit | Attending: Internal Medicine | Admitting: Internal Medicine

## 2019-12-10 ENCOUNTER — Encounter (HOSPITAL_COMMUNITY): Payer: Self-pay

## 2019-12-10 DIAGNOSIS — M810 Age-related osteoporosis without current pathological fracture: Secondary | ICD-10-CM | POA: Insufficient documentation

## 2019-12-10 MED ORDER — DENOSUMAB 60 MG/ML ~~LOC~~ SOSY
60.0000 mg | PREFILLED_SYRINGE | Freq: Once | SUBCUTANEOUS | Status: AC
  Administered 2019-12-10: 09:00:00 60 mg via SUBCUTANEOUS
  Filled 2019-12-10: qty 1

## 2020-01-16 DIAGNOSIS — E1165 Type 2 diabetes mellitus with hyperglycemia: Secondary | ICD-10-CM | POA: Diagnosis not present

## 2020-01-16 DIAGNOSIS — E039 Hypothyroidism, unspecified: Secondary | ICD-10-CM | POA: Diagnosis not present

## 2020-01-16 DIAGNOSIS — I10 Essential (primary) hypertension: Secondary | ICD-10-CM | POA: Diagnosis not present

## 2020-01-16 DIAGNOSIS — E1122 Type 2 diabetes mellitus with diabetic chronic kidney disease: Secondary | ICD-10-CM | POA: Diagnosis not present

## 2020-01-16 DIAGNOSIS — E782 Mixed hyperlipidemia: Secondary | ICD-10-CM | POA: Diagnosis not present

## 2020-01-16 DIAGNOSIS — E785 Hyperlipidemia, unspecified: Secondary | ICD-10-CM | POA: Diagnosis not present

## 2020-01-21 DIAGNOSIS — I129 Hypertensive chronic kidney disease with stage 1 through stage 4 chronic kidney disease, or unspecified chronic kidney disease: Secondary | ICD-10-CM | POA: Diagnosis not present

## 2020-01-21 DIAGNOSIS — E782 Mixed hyperlipidemia: Secondary | ICD-10-CM | POA: Diagnosis not present

## 2020-01-21 DIAGNOSIS — Z6831 Body mass index (BMI) 31.0-31.9, adult: Secondary | ICD-10-CM | POA: Diagnosis not present

## 2020-01-21 DIAGNOSIS — E1122 Type 2 diabetes mellitus with diabetic chronic kidney disease: Secondary | ICD-10-CM | POA: Diagnosis not present

## 2020-01-21 DIAGNOSIS — F331 Major depressive disorder, recurrent, moderate: Secondary | ICD-10-CM | POA: Diagnosis not present

## 2020-01-21 DIAGNOSIS — N1831 Chronic kidney disease, stage 3a: Secondary | ICD-10-CM | POA: Diagnosis not present

## 2020-01-21 DIAGNOSIS — G4700 Insomnia, unspecified: Secondary | ICD-10-CM | POA: Diagnosis not present

## 2020-01-21 DIAGNOSIS — E559 Vitamin D deficiency, unspecified: Secondary | ICD-10-CM | POA: Diagnosis not present

## 2020-01-21 DIAGNOSIS — M810 Age-related osteoporosis without current pathological fracture: Secondary | ICD-10-CM | POA: Diagnosis not present

## 2020-01-21 DIAGNOSIS — E1165 Type 2 diabetes mellitus with hyperglycemia: Secondary | ICD-10-CM | POA: Diagnosis not present

## 2020-01-21 DIAGNOSIS — E039 Hypothyroidism, unspecified: Secondary | ICD-10-CM | POA: Diagnosis not present

## 2020-03-23 DIAGNOSIS — Z23 Encounter for immunization: Secondary | ICD-10-CM | POA: Diagnosis not present

## 2020-04-01 DIAGNOSIS — M545 Low back pain: Secondary | ICD-10-CM | POA: Diagnosis not present

## 2020-04-20 DIAGNOSIS — Z23 Encounter for immunization: Secondary | ICD-10-CM | POA: Diagnosis not present

## 2020-05-18 DIAGNOSIS — F331 Major depressive disorder, recurrent, moderate: Secondary | ICD-10-CM | POA: Diagnosis not present

## 2020-05-18 DIAGNOSIS — I1310 Hypertensive heart and chronic kidney disease without heart failure, with stage 1 through stage 4 chronic kidney disease, or unspecified chronic kidney disease: Secondary | ICD-10-CM | POA: Diagnosis not present

## 2020-05-18 DIAGNOSIS — E1122 Type 2 diabetes mellitus with diabetic chronic kidney disease: Secondary | ICD-10-CM | POA: Diagnosis not present

## 2020-05-18 DIAGNOSIS — E1165 Type 2 diabetes mellitus with hyperglycemia: Secondary | ICD-10-CM | POA: Diagnosis not present

## 2020-05-18 DIAGNOSIS — E785 Hyperlipidemia, unspecified: Secondary | ICD-10-CM | POA: Diagnosis not present

## 2020-05-18 DIAGNOSIS — F411 Generalized anxiety disorder: Secondary | ICD-10-CM | POA: Diagnosis not present

## 2020-05-18 DIAGNOSIS — I129 Hypertensive chronic kidney disease with stage 1 through stage 4 chronic kidney disease, or unspecified chronic kidney disease: Secondary | ICD-10-CM | POA: Diagnosis not present

## 2020-05-18 DIAGNOSIS — G4700 Insomnia, unspecified: Secondary | ICD-10-CM | POA: Diagnosis not present

## 2020-05-18 DIAGNOSIS — I10 Essential (primary) hypertension: Secondary | ICD-10-CM | POA: Diagnosis not present

## 2020-05-18 DIAGNOSIS — E782 Mixed hyperlipidemia: Secondary | ICD-10-CM | POA: Diagnosis not present

## 2020-05-18 DIAGNOSIS — E039 Hypothyroidism, unspecified: Secondary | ICD-10-CM | POA: Diagnosis not present

## 2020-05-18 DIAGNOSIS — E559 Vitamin D deficiency, unspecified: Secondary | ICD-10-CM | POA: Diagnosis not present

## 2020-05-22 DIAGNOSIS — E1122 Type 2 diabetes mellitus with diabetic chronic kidney disease: Secondary | ICD-10-CM | POA: Diagnosis not present

## 2020-05-22 DIAGNOSIS — F331 Major depressive disorder, recurrent, moderate: Secondary | ICD-10-CM | POA: Diagnosis not present

## 2020-05-22 DIAGNOSIS — I129 Hypertensive chronic kidney disease with stage 1 through stage 4 chronic kidney disease, or unspecified chronic kidney disease: Secondary | ICD-10-CM | POA: Diagnosis not present

## 2020-05-22 DIAGNOSIS — Z6829 Body mass index (BMI) 29.0-29.9, adult: Secondary | ICD-10-CM | POA: Diagnosis not present

## 2020-05-22 DIAGNOSIS — E785 Hyperlipidemia, unspecified: Secondary | ICD-10-CM | POA: Diagnosis not present

## 2020-05-22 DIAGNOSIS — E559 Vitamin D deficiency, unspecified: Secondary | ICD-10-CM | POA: Diagnosis not present

## 2020-05-22 DIAGNOSIS — G4700 Insomnia, unspecified: Secondary | ICD-10-CM | POA: Diagnosis not present

## 2020-05-22 DIAGNOSIS — E782 Mixed hyperlipidemia: Secondary | ICD-10-CM | POA: Diagnosis not present

## 2020-05-22 DIAGNOSIS — E039 Hypothyroidism, unspecified: Secondary | ICD-10-CM | POA: Diagnosis not present

## 2020-05-22 DIAGNOSIS — N1831 Chronic kidney disease, stage 3a: Secondary | ICD-10-CM | POA: Diagnosis not present

## 2020-05-22 DIAGNOSIS — M810 Age-related osteoporosis without current pathological fracture: Secondary | ICD-10-CM | POA: Diagnosis not present

## 2020-05-22 DIAGNOSIS — E1165 Type 2 diabetes mellitus with hyperglycemia: Secondary | ICD-10-CM | POA: Diagnosis not present

## 2020-05-22 DIAGNOSIS — E6609 Other obesity due to excess calories: Secondary | ICD-10-CM | POA: Diagnosis not present

## 2020-05-22 DIAGNOSIS — I10 Essential (primary) hypertension: Secondary | ICD-10-CM | POA: Diagnosis not present

## 2020-05-22 DIAGNOSIS — Z0001 Encounter for general adult medical examination with abnormal findings: Secondary | ICD-10-CM | POA: Diagnosis not present

## 2020-05-22 DIAGNOSIS — Z6831 Body mass index (BMI) 31.0-31.9, adult: Secondary | ICD-10-CM | POA: Diagnosis not present

## 2020-06-09 ENCOUNTER — Encounter (HOSPITAL_COMMUNITY)
Admission: RE | Admit: 2020-06-09 | Discharge: 2020-06-09 | Disposition: A | Discharge status: Home / Self Care | Payer: Medicare Other | Source: Ambulatory Visit | Attending: Internal Medicine | Admitting: Internal Medicine

## 2020-06-09 ENCOUNTER — Encounter (HOSPITAL_COMMUNITY): Payer: Self-pay

## 2020-06-09 ENCOUNTER — Other Ambulatory Visit: Payer: Self-pay

## 2020-06-09 DIAGNOSIS — M810 Age-related osteoporosis without current pathological fracture: Secondary | ICD-10-CM | POA: Diagnosis not present

## 2020-06-09 MED ORDER — DENOSUMAB 60 MG/ML ~~LOC~~ SOSY
60.0000 mg | PREFILLED_SYRINGE | Freq: Once | SUBCUTANEOUS | Status: AC
  Administered 2020-06-09: 60 mg via SUBCUTANEOUS

## 2020-07-29 DIAGNOSIS — M545 Low back pain: Secondary | ICD-10-CM | POA: Diagnosis not present

## 2020-07-29 DIAGNOSIS — M810 Age-related osteoporosis without current pathological fracture: Secondary | ICD-10-CM | POA: Diagnosis not present

## 2020-07-29 DIAGNOSIS — E782 Mixed hyperlipidemia: Secondary | ICD-10-CM | POA: Diagnosis not present

## 2020-09-21 DIAGNOSIS — I129 Hypertensive chronic kidney disease with stage 1 through stage 4 chronic kidney disease, or unspecified chronic kidney disease: Secondary | ICD-10-CM | POA: Diagnosis not present

## 2020-09-21 DIAGNOSIS — R197 Diarrhea, unspecified: Secondary | ICD-10-CM | POA: Diagnosis not present

## 2020-09-21 DIAGNOSIS — K219 Gastro-esophageal reflux disease without esophagitis: Secondary | ICD-10-CM | POA: Diagnosis not present

## 2020-09-21 DIAGNOSIS — N1831 Chronic kidney disease, stage 3a: Secondary | ICD-10-CM | POA: Diagnosis not present

## 2020-09-21 DIAGNOSIS — F411 Generalized anxiety disorder: Secondary | ICD-10-CM | POA: Diagnosis not present

## 2020-09-21 DIAGNOSIS — M545 Low back pain: Secondary | ICD-10-CM | POA: Diagnosis not present

## 2020-09-21 DIAGNOSIS — F331 Major depressive disorder, recurrent, moderate: Secondary | ICD-10-CM | POA: Diagnosis not present

## 2020-09-21 DIAGNOSIS — N3281 Overactive bladder: Secondary | ICD-10-CM | POA: Diagnosis not present

## 2020-09-21 DIAGNOSIS — E1165 Type 2 diabetes mellitus with hyperglycemia: Secondary | ICD-10-CM | POA: Diagnosis not present

## 2020-09-21 DIAGNOSIS — E6609 Other obesity due to excess calories: Secondary | ICD-10-CM | POA: Diagnosis not present

## 2020-09-21 DIAGNOSIS — M810 Age-related osteoporosis without current pathological fracture: Secondary | ICD-10-CM | POA: Diagnosis not present

## 2020-09-21 DIAGNOSIS — E559 Vitamin D deficiency, unspecified: Secondary | ICD-10-CM | POA: Diagnosis not present

## 2020-09-25 DIAGNOSIS — E039 Hypothyroidism, unspecified: Secondary | ICD-10-CM | POA: Diagnosis not present

## 2020-09-25 DIAGNOSIS — Z6831 Body mass index (BMI) 31.0-31.9, adult: Secondary | ICD-10-CM | POA: Diagnosis not present

## 2020-09-25 DIAGNOSIS — G4700 Insomnia, unspecified: Secondary | ICD-10-CM | POA: Diagnosis not present

## 2020-09-25 DIAGNOSIS — E782 Mixed hyperlipidemia: Secondary | ICD-10-CM | POA: Diagnosis not present

## 2020-09-25 DIAGNOSIS — F331 Major depressive disorder, recurrent, moderate: Secondary | ICD-10-CM | POA: Diagnosis not present

## 2020-09-25 DIAGNOSIS — E1165 Type 2 diabetes mellitus with hyperglycemia: Secondary | ICD-10-CM | POA: Diagnosis not present

## 2020-09-25 DIAGNOSIS — E559 Vitamin D deficiency, unspecified: Secondary | ICD-10-CM | POA: Diagnosis not present

## 2020-09-25 DIAGNOSIS — E6609 Other obesity due to excess calories: Secondary | ICD-10-CM | POA: Diagnosis not present

## 2020-09-25 DIAGNOSIS — N1831 Chronic kidney disease, stage 3a: Secondary | ICD-10-CM | POA: Diagnosis not present

## 2020-09-25 DIAGNOSIS — I129 Hypertensive chronic kidney disease with stage 1 through stage 4 chronic kidney disease, or unspecified chronic kidney disease: Secondary | ICD-10-CM | POA: Diagnosis not present

## 2020-09-25 DIAGNOSIS — E1122 Type 2 diabetes mellitus with diabetic chronic kidney disease: Secondary | ICD-10-CM | POA: Diagnosis not present

## 2020-09-25 DIAGNOSIS — M810 Age-related osteoporosis without current pathological fracture: Secondary | ICD-10-CM | POA: Diagnosis not present

## 2020-10-26 ENCOUNTER — Other Ambulatory Visit (HOSPITAL_COMMUNITY): Payer: Self-pay | Admitting: Internal Medicine

## 2020-10-26 DIAGNOSIS — Z23 Encounter for immunization: Secondary | ICD-10-CM | POA: Diagnosis not present

## 2020-10-26 DIAGNOSIS — Z1231 Encounter for screening mammogram for malignant neoplasm of breast: Secondary | ICD-10-CM

## 2020-11-02 ENCOUNTER — Ambulatory Visit (HOSPITAL_COMMUNITY)
Admission: RE | Admit: 2020-11-02 | Discharge: 2020-11-02 | Disposition: A | Discharge status: Home / Self Care | Payer: Medicare Other | Source: Ambulatory Visit | Attending: Internal Medicine | Admitting: Internal Medicine

## 2020-11-02 ENCOUNTER — Other Ambulatory Visit: Payer: Self-pay

## 2020-11-02 DIAGNOSIS — Z1231 Encounter for screening mammogram for malignant neoplasm of breast: Secondary | ICD-10-CM | POA: Insufficient documentation

## 2020-12-04 DIAGNOSIS — J019 Acute sinusitis, unspecified: Secondary | ICD-10-CM | POA: Diagnosis not present

## 2020-12-09 ENCOUNTER — Encounter (HOSPITAL_COMMUNITY): Payer: Self-pay

## 2020-12-09 ENCOUNTER — Other Ambulatory Visit: Payer: Self-pay

## 2020-12-09 ENCOUNTER — Encounter (HOSPITAL_COMMUNITY)
Admission: RE | Admit: 2020-12-09 | Discharge: 2020-12-09 | Disposition: A | Discharge status: Home / Self Care | Payer: Medicare Other | Source: Ambulatory Visit | Attending: Internal Medicine | Admitting: Internal Medicine

## 2020-12-09 DIAGNOSIS — M810 Age-related osteoporosis without current pathological fracture: Secondary | ICD-10-CM | POA: Insufficient documentation

## 2020-12-09 MED ORDER — DENOSUMAB 60 MG/ML ~~LOC~~ SOSY
60.0000 mg | PREFILLED_SYRINGE | Freq: Once | SUBCUTANEOUS | Status: AC
  Administered 2020-12-09: 60 mg via SUBCUTANEOUS

## 2020-12-11 DIAGNOSIS — I129 Hypertensive chronic kidney disease with stage 1 through stage 4 chronic kidney disease, or unspecified chronic kidney disease: Secondary | ICD-10-CM | POA: Diagnosis not present

## 2020-12-11 DIAGNOSIS — E559 Vitamin D deficiency, unspecified: Secondary | ICD-10-CM | POA: Diagnosis not present

## 2020-12-11 DIAGNOSIS — R197 Diarrhea, unspecified: Secondary | ICD-10-CM | POA: Diagnosis not present

## 2020-12-11 DIAGNOSIS — G4700 Insomnia, unspecified: Secondary | ICD-10-CM | POA: Diagnosis not present

## 2020-12-11 DIAGNOSIS — E6609 Other obesity due to excess calories: Secondary | ICD-10-CM | POA: Diagnosis not present

## 2020-12-11 DIAGNOSIS — M810 Age-related osteoporosis without current pathological fracture: Secondary | ICD-10-CM | POA: Diagnosis not present

## 2020-12-11 DIAGNOSIS — N1831 Chronic kidney disease, stage 3a: Secondary | ICD-10-CM | POA: Diagnosis not present

## 2020-12-11 DIAGNOSIS — E1165 Type 2 diabetes mellitus with hyperglycemia: Secondary | ICD-10-CM | POA: Diagnosis not present

## 2020-12-11 DIAGNOSIS — R42 Dizziness and giddiness: Secondary | ICD-10-CM | POA: Diagnosis not present

## 2020-12-11 DIAGNOSIS — R0789 Other chest pain: Secondary | ICD-10-CM | POA: Diagnosis not present

## 2020-12-11 DIAGNOSIS — K219 Gastro-esophageal reflux disease without esophagitis: Secondary | ICD-10-CM | POA: Diagnosis not present

## 2020-12-11 DIAGNOSIS — R110 Nausea: Secondary | ICD-10-CM | POA: Diagnosis not present

## 2020-12-11 DIAGNOSIS — F411 Generalized anxiety disorder: Secondary | ICD-10-CM | POA: Diagnosis not present

## 2020-12-11 DIAGNOSIS — N3281 Overactive bladder: Secondary | ICD-10-CM | POA: Diagnosis not present

## 2020-12-11 DIAGNOSIS — F331 Major depressive disorder, recurrent, moderate: Secondary | ICD-10-CM | POA: Diagnosis not present

## 2020-12-15 DIAGNOSIS — D518 Other vitamin B12 deficiency anemias: Secondary | ICD-10-CM | POA: Diagnosis not present

## 2020-12-15 DIAGNOSIS — I10 Essential (primary) hypertension: Secondary | ICD-10-CM | POA: Diagnosis not present

## 2020-12-15 DIAGNOSIS — R42 Dizziness and giddiness: Secondary | ICD-10-CM | POA: Diagnosis not present

## 2020-12-16 DIAGNOSIS — D519 Vitamin B12 deficiency anemia, unspecified: Secondary | ICD-10-CM | POA: Diagnosis not present

## 2021-01-04 ENCOUNTER — Ambulatory Visit (HOSPITAL_COMMUNITY): Payer: Medicare Other

## 2021-01-13 ENCOUNTER — Ambulatory Visit (HOSPITAL_COMMUNITY): Payer: Medicare Other

## 2021-01-21 DIAGNOSIS — Z961 Presence of intraocular lens: Secondary | ICD-10-CM | POA: Diagnosis not present

## 2021-01-21 DIAGNOSIS — Z7984 Long term (current) use of oral hypoglycemic drugs: Secondary | ICD-10-CM | POA: Diagnosis not present

## 2021-01-21 DIAGNOSIS — E119 Type 2 diabetes mellitus without complications: Secondary | ICD-10-CM | POA: Diagnosis not present

## 2021-01-26 DIAGNOSIS — D519 Vitamin B12 deficiency anemia, unspecified: Secondary | ICD-10-CM | POA: Diagnosis not present

## 2021-02-08 ENCOUNTER — Other Ambulatory Visit: Payer: Self-pay

## 2021-02-08 ENCOUNTER — Ambulatory Visit (HOSPITAL_COMMUNITY)
Admission: RE | Admit: 2021-02-08 | Discharge: 2021-02-08 | Disposition: A | Discharge status: Home / Self Care | Payer: Medicare Other | Source: Ambulatory Visit | Attending: Internal Medicine | Admitting: Internal Medicine

## 2021-02-08 DIAGNOSIS — Z1231 Encounter for screening mammogram for malignant neoplasm of breast: Secondary | ICD-10-CM | POA: Insufficient documentation

## 2021-03-25 DIAGNOSIS — D519 Vitamin B12 deficiency anemia, unspecified: Secondary | ICD-10-CM | POA: Diagnosis not present

## 2021-04-08 DIAGNOSIS — E1165 Type 2 diabetes mellitus with hyperglycemia: Secondary | ICD-10-CM | POA: Diagnosis not present

## 2021-04-08 DIAGNOSIS — E538 Deficiency of other specified B group vitamins: Secondary | ICD-10-CM | POA: Diagnosis not present

## 2021-04-08 DIAGNOSIS — E039 Hypothyroidism, unspecified: Secondary | ICD-10-CM | POA: Diagnosis not present

## 2021-04-08 DIAGNOSIS — I10 Essential (primary) hypertension: Secondary | ICD-10-CM | POA: Diagnosis not present

## 2021-04-08 DIAGNOSIS — E782 Mixed hyperlipidemia: Secondary | ICD-10-CM | POA: Diagnosis not present

## 2021-04-16 DIAGNOSIS — F411 Generalized anxiety disorder: Secondary | ICD-10-CM | POA: Diagnosis not present

## 2021-04-16 DIAGNOSIS — E782 Mixed hyperlipidemia: Secondary | ICD-10-CM | POA: Diagnosis not present

## 2021-04-16 DIAGNOSIS — G4700 Insomnia, unspecified: Secondary | ICD-10-CM | POA: Diagnosis not present

## 2021-04-16 DIAGNOSIS — E039 Hypothyroidism, unspecified: Secondary | ICD-10-CM | POA: Diagnosis not present

## 2021-04-16 DIAGNOSIS — E1165 Type 2 diabetes mellitus with hyperglycemia: Secondary | ICD-10-CM | POA: Diagnosis not present

## 2021-04-16 DIAGNOSIS — K219 Gastro-esophageal reflux disease without esophagitis: Secondary | ICD-10-CM | POA: Diagnosis not present

## 2021-04-16 DIAGNOSIS — G43009 Migraine without aura, not intractable, without status migrainosus: Secondary | ICD-10-CM | POA: Diagnosis not present

## 2021-06-09 ENCOUNTER — Other Ambulatory Visit: Payer: Self-pay

## 2021-06-09 ENCOUNTER — Encounter (HOSPITAL_COMMUNITY)
Admission: RE | Admit: 2021-06-09 | Discharge: 2021-06-09 | Disposition: A | Discharge status: Home / Self Care | Payer: Medicare Other | Source: Ambulatory Visit | Attending: Internal Medicine | Admitting: Internal Medicine

## 2021-06-09 DIAGNOSIS — M810 Age-related osteoporosis without current pathological fracture: Secondary | ICD-10-CM | POA: Diagnosis not present

## 2021-06-09 MED ORDER — DENOSUMAB 60 MG/ML ~~LOC~~ SOSY
60.0000 mg | PREFILLED_SYRINGE | Freq: Once | SUBCUTANEOUS | Status: AC
  Administered 2021-06-09: 60 mg via SUBCUTANEOUS

## 2021-06-09 MED ORDER — DENOSUMAB 60 MG/ML ~~LOC~~ SOSY
PREFILLED_SYRINGE | SUBCUTANEOUS | Status: AC
  Filled 2021-06-09: qty 1

## 2021-08-06 DIAGNOSIS — G8929 Other chronic pain: Secondary | ICD-10-CM | POA: Diagnosis not present

## 2021-08-06 DIAGNOSIS — M5137 Other intervertebral disc degeneration, lumbosacral region: Secondary | ICD-10-CM | POA: Diagnosis not present

## 2021-08-06 DIAGNOSIS — F411 Generalized anxiety disorder: Secondary | ICD-10-CM | POA: Diagnosis not present

## 2021-08-06 DIAGNOSIS — M545 Low back pain, unspecified: Secondary | ICD-10-CM | POA: Diagnosis not present

## 2021-10-11 DIAGNOSIS — E782 Mixed hyperlipidemia: Secondary | ICD-10-CM | POA: Diagnosis not present

## 2021-10-11 DIAGNOSIS — E039 Hypothyroidism, unspecified: Secondary | ICD-10-CM | POA: Diagnosis not present

## 2021-10-11 DIAGNOSIS — M5137 Other intervertebral disc degeneration, lumbosacral region: Secondary | ICD-10-CM | POA: Diagnosis not present

## 2021-10-11 DIAGNOSIS — M545 Low back pain, unspecified: Secondary | ICD-10-CM | POA: Diagnosis not present

## 2021-10-11 DIAGNOSIS — G8929 Other chronic pain: Secondary | ICD-10-CM | POA: Diagnosis not present

## 2021-10-11 DIAGNOSIS — G4700 Insomnia, unspecified: Secondary | ICD-10-CM | POA: Diagnosis not present

## 2021-10-11 DIAGNOSIS — E1165 Type 2 diabetes mellitus with hyperglycemia: Secondary | ICD-10-CM | POA: Diagnosis not present

## 2021-10-11 DIAGNOSIS — Z23 Encounter for immunization: Secondary | ICD-10-CM | POA: Diagnosis not present

## 2021-10-11 DIAGNOSIS — E559 Vitamin D deficiency, unspecified: Secondary | ICD-10-CM | POA: Diagnosis not present

## 2021-10-11 DIAGNOSIS — F411 Generalized anxiety disorder: Secondary | ICD-10-CM | POA: Diagnosis not present

## 2021-11-07 DIAGNOSIS — U071 COVID-19: Secondary | ICD-10-CM | POA: Diagnosis not present

## 2021-12-09 ENCOUNTER — Encounter (HOSPITAL_COMMUNITY): Payer: Self-pay

## 2021-12-09 ENCOUNTER — Other Ambulatory Visit: Payer: Self-pay

## 2021-12-09 ENCOUNTER — Encounter (HOSPITAL_COMMUNITY)
Admission: RE | Admit: 2021-12-09 | Discharge: 2021-12-09 | Disposition: A | Discharge status: Home / Self Care | Payer: Medicare Other | Source: Ambulatory Visit | Attending: Internal Medicine | Admitting: Internal Medicine

## 2021-12-09 DIAGNOSIS — M810 Age-related osteoporosis without current pathological fracture: Secondary | ICD-10-CM | POA: Insufficient documentation

## 2021-12-09 MED ORDER — DENOSUMAB 60 MG/ML ~~LOC~~ SOSY
60.0000 mg | PREFILLED_SYRINGE | Freq: Once | SUBCUTANEOUS | Status: AC
  Administered 2021-12-09: 60 mg via SUBCUTANEOUS
  Filled 2021-12-09: qty 1

## 2021-12-30 DIAGNOSIS — Z20822 Contact with and (suspected) exposure to covid-19: Secondary | ICD-10-CM | POA: Diagnosis not present

## 2022-01-03 ENCOUNTER — Other Ambulatory Visit (HOSPITAL_COMMUNITY): Payer: Self-pay | Admitting: Adult Health Nurse Practitioner

## 2022-01-03 DIAGNOSIS — Z1231 Encounter for screening mammogram for malignant neoplasm of breast: Secondary | ICD-10-CM

## 2022-02-11 DIAGNOSIS — Z20822 Contact with and (suspected) exposure to covid-19: Secondary | ICD-10-CM | POA: Diagnosis not present

## 2022-02-14 ENCOUNTER — Ambulatory Visit (HOSPITAL_COMMUNITY)
Admission: RE | Admit: 2022-02-14 | Discharge: 2022-02-14 | Disposition: A | Discharge status: Home / Self Care | Payer: Medicare Other | Source: Ambulatory Visit | Attending: Adult Health Nurse Practitioner | Admitting: Adult Health Nurse Practitioner

## 2022-02-14 ENCOUNTER — Other Ambulatory Visit: Payer: Self-pay

## 2022-02-14 DIAGNOSIS — Z1231 Encounter for screening mammogram for malignant neoplasm of breast: Secondary | ICD-10-CM | POA: Insufficient documentation

## 2022-02-28 DIAGNOSIS — E119 Type 2 diabetes mellitus without complications: Secondary | ICD-10-CM | POA: Diagnosis not present

## 2022-02-28 DIAGNOSIS — Z961 Presence of intraocular lens: Secondary | ICD-10-CM | POA: Diagnosis not present

## 2022-02-28 DIAGNOSIS — H524 Presbyopia: Secondary | ICD-10-CM | POA: Diagnosis not present

## 2022-02-28 DIAGNOSIS — Z7984 Long term (current) use of oral hypoglycemic drugs: Secondary | ICD-10-CM | POA: Diagnosis not present

## 2022-02-28 DIAGNOSIS — H16223 Keratoconjunctivitis sicca, not specified as Sjogren's, bilateral: Secondary | ICD-10-CM | POA: Diagnosis not present

## 2022-03-03 DIAGNOSIS — Z20822 Contact with and (suspected) exposure to covid-19: Secondary | ICD-10-CM | POA: Diagnosis not present

## 2022-03-23 DIAGNOSIS — Z23 Encounter for immunization: Secondary | ICD-10-CM | POA: Diagnosis not present

## 2022-03-23 DIAGNOSIS — M810 Age-related osteoporosis without current pathological fracture: Secondary | ICD-10-CM | POA: Diagnosis not present

## 2022-03-23 DIAGNOSIS — E1165 Type 2 diabetes mellitus with hyperglycemia: Secondary | ICD-10-CM | POA: Diagnosis not present

## 2022-03-23 DIAGNOSIS — G4700 Insomnia, unspecified: Secondary | ICD-10-CM | POA: Diagnosis not present

## 2022-03-23 DIAGNOSIS — J0110 Acute frontal sinusitis, unspecified: Secondary | ICD-10-CM | POA: Diagnosis not present

## 2022-03-23 DIAGNOSIS — K219 Gastro-esophageal reflux disease without esophagitis: Secondary | ICD-10-CM | POA: Diagnosis not present

## 2022-03-23 DIAGNOSIS — F411 Generalized anxiety disorder: Secondary | ICD-10-CM | POA: Diagnosis not present

## 2022-03-23 LAB — HEMOGLOBIN A1C: Hemoglobin A1C: 12.1

## 2022-03-25 DIAGNOSIS — Z20822 Contact with and (suspected) exposure to covid-19: Secondary | ICD-10-CM | POA: Diagnosis not present

## 2022-04-11 DIAGNOSIS — Z79899 Other long term (current) drug therapy: Secondary | ICD-10-CM | POA: Diagnosis not present

## 2022-04-11 DIAGNOSIS — E1165 Type 2 diabetes mellitus with hyperglycemia: Secondary | ICD-10-CM | POA: Diagnosis not present

## 2022-04-11 DIAGNOSIS — R5383 Other fatigue: Secondary | ICD-10-CM | POA: Diagnosis not present

## 2022-04-11 DIAGNOSIS — I517 Cardiomegaly: Secondary | ICD-10-CM | POA: Diagnosis not present

## 2022-04-11 DIAGNOSIS — Z7984 Long term (current) use of oral hypoglycemic drugs: Secondary | ICD-10-CM | POA: Diagnosis not present

## 2022-04-11 DIAGNOSIS — Z20822 Contact with and (suspected) exposure to covid-19: Secondary | ICD-10-CM | POA: Diagnosis not present

## 2022-04-11 DIAGNOSIS — Z794 Long term (current) use of insulin: Secondary | ICD-10-CM | POA: Diagnosis not present

## 2022-04-11 DIAGNOSIS — I10 Essential (primary) hypertension: Secondary | ICD-10-CM | POA: Diagnosis not present

## 2022-04-11 DIAGNOSIS — R918 Other nonspecific abnormal finding of lung field: Secondary | ICD-10-CM | POA: Diagnosis not present

## 2022-04-11 DIAGNOSIS — R531 Weakness: Secondary | ICD-10-CM | POA: Diagnosis not present

## 2022-04-20 ENCOUNTER — Other Ambulatory Visit (HOSPITAL_COMMUNITY): Payer: Self-pay | Admitting: Adult Health Nurse Practitioner

## 2022-04-20 DIAGNOSIS — M810 Age-related osteoporosis without current pathological fracture: Secondary | ICD-10-CM

## 2022-04-22 ENCOUNTER — Ambulatory Visit (INDEPENDENT_AMBULATORY_CARE_PROVIDER_SITE_OTHER): Payer: Medicare Other | Admitting: "Endocrinology

## 2022-04-22 ENCOUNTER — Encounter: Payer: Self-pay | Admitting: "Endocrinology

## 2022-04-22 VITALS — BP 134/84 | HR 76 | Ht 64.5 in | Wt 174.8 lb

## 2022-04-22 DIAGNOSIS — E039 Hypothyroidism, unspecified: Secondary | ICD-10-CM | POA: Diagnosis not present

## 2022-04-22 DIAGNOSIS — I10 Essential (primary) hypertension: Secondary | ICD-10-CM

## 2022-04-22 DIAGNOSIS — E782 Mixed hyperlipidemia: Secondary | ICD-10-CM | POA: Diagnosis not present

## 2022-04-22 DIAGNOSIS — N3281 Overactive bladder: Secondary | ICD-10-CM | POA: Diagnosis not present

## 2022-04-22 DIAGNOSIS — E1165 Type 2 diabetes mellitus with hyperglycemia: Secondary | ICD-10-CM

## 2022-04-22 DIAGNOSIS — F331 Major depressive disorder, recurrent, moderate: Secondary | ICD-10-CM | POA: Diagnosis not present

## 2022-04-22 DIAGNOSIS — Z0000 Encounter for general adult medical examination without abnormal findings: Secondary | ICD-10-CM | POA: Diagnosis not present

## 2022-04-22 DIAGNOSIS — E890 Postprocedural hypothyroidism: Secondary | ICD-10-CM

## 2022-04-22 HISTORY — DX: Essential (primary) hypertension: I10

## 2022-04-22 HISTORY — DX: Type 2 diabetes mellitus with hyperglycemia: E11.65

## 2022-04-22 NOTE — Patient Instructions (Signed)

## 2022-04-22 NOTE — Progress Notes (Signed)
? ?                                                             Endocrinology Consult Note  ?     04/22/2022, 1:58 PM ? ? ?Subjective:  ? ? Patient ID: [name], female    DOB: 1946-10-14.  ?[name] is being seen in consultation for management of currently uncontrolled symptomatic diabetes requested by  Pablo Lawrence, NP. ? ? ?Past Medical History:  ?Diagnosis Date  ? Diverticulosis   ? DM (diabetes mellitus) ([place])   ? Esophageal stricture   ? Gastritis   ? GERD (gastroesophageal reflux disease)   ? Heart valve problem   ? Hypercholesterolemia   ? can't take meds, affects LFTs  ? Hypothyroidism   ? OSA on CPAP   ? ? ?Past Surgical History:  ?Procedure Laterality Date  ? APPENDECTOMY    ? CHOLECYSTECTOMY    ? ESOPHAGOGASTRODUODENOSCOPY  09/28/11  ? mild gastritis/stricture in the distal esophagus/small gastric polyps benign  ? LEFT HEART CATH AND CORONARY ANGIOGRAPHY N/A 08/22/2017  ? Procedure: LEFT HEART CATH AND CORONARY ANGIOGRAPHY;  Surgeon: Belva Crome, MD;  Location: [facility];  Service: Cardiovascular;  Laterality: N/A;  ? THYROID SURGERY  2008  ? left  thyroid removed  ? TOTAL ABDOMINAL HYSTERECTOMY    ? TUBAL LIGATION    ? YAG LASER APPLICATION Right 1/74/9449  ? Procedure: YAG LASER APPLICATION;  Surgeon: Rutherford Guys, MD;  Location: [facility];  Service: Ophthalmology;  Laterality: Right;  ? YAG LASER APPLICATION Left 6/75/9163  ? Procedure: YAG LASER APPLICATION;  Surgeon: Rutherford Guys, MD;  Location: [facility];  Service: Ophthalmology;  Laterality: Left;  ? ? ?Social History  ? ?Socioeconomic History  ? Marital status: Widowed  ?  Spouse name: Not on file  ? Number of children: 3  ? Years of education: Not on file  ? Highest education level: Not on file  ?Occupational History  ? Occupation: retired  ?  Comment: [employer]   ?Tobacco Use  ? Smoking status: Never  ? Smokeless tobacco: Never  ?Vaping Use  ? Vaping Use: Never used  ?Substance and Sexual Activity  ?  Alcohol use: No  ?  Alcohol/week: 0.0 standard drinks  ? Drug use: No  ? Sexual activity: Not on file  ?Other Topics Concern  ? Not on file  ?Social History Narrative  ? Not on file  ? ?Social Determinants of Health  ? ?Financial Resource Strain: Not on file  ?Food Insecurity: Not on file  ?Transportation Needs: Not on file  ?Physical Activity: Not on file  ?Stress: Not on file  ?Social Connections: Not on file  ? ? ?Family History  ?Problem Relation Age of Onset  ? Hypertension Mother   ? Colon cancer Mother   ?     in remission, diagnosed age 52  ? Hyperlipidemia Mother   ? Anesthesia problems Neg Hx   ? Hypotension Neg Hx   ? Malignant hyperthermia Neg Hx   ? Pseudochol deficiency Neg Hx   ? ? ?Outpatient Encounter Medications as of 04/22/2022  ?Medication Sig  ? Cyanocobalamin (VITAMIN B-12 PO) Take by mouth daily.  ? insulin glargine (LANTUS) 100 UNIT/ML Solostar Pen Inject 40 Units into  the skin at bedtime.  ? omeprazole (PRILOSEC) 40 MG capsule Take by mouth daily as needed.  ? ALPRAZolam (XANAX) 1 MG tablet Take 1 mg by mouth at bedtime as needed for anxiety.  ? Cholecalciferol (VITAMIN D3) 5000 units TABS Take by mouth.  ? denosumab (PROLIA) 60 MG/ML SOSY injection Inject 60 mg into the skin every 6 (six) months.  ? esomeprazole (NEXIUM) 40 MG capsule Take 40 mg by mouth daily as needed (acid reflux).   ? glipiZIDE (GLUCOTROL) 10 MG tablet Take 5 mg by mouth daily with breakfast.  ? levothyroxine (SYNTHROID, LEVOTHROID) 75 MCG tablet Take 75 mcg by mouth daily before breakfast.  ? losartan (COZAAR) 50 MG tablet Take 1 tablet (50 mg total) by mouth daily.  ? metFORMIN (GLUCOPHAGE) 500 MG tablet Take 500 mg by mouth daily after breakfast.  ? simvastatin (ZOCOR) 20 MG tablet Take 20 mg by mouth every other day.  ? ?No facility-administered encounter medications on file as of 04/22/2022.  ? ? ?ALLERGIES: ?No Known Allergies ? ?VACCINATION STATUS: ?Immunization History  ?Administered Date(s) Administered  ?  Influenza-Unspecified 08/26/2013, 08/26/2014, 09/25/2018  ? ? ?Diabetes ?She presents for her initial diabetic visit. She has type 2 diabetes mellitus. Onset time: She was diagnosed at approximate age of 77 years. Her disease course has been worsening. There are no hypoglycemic associated symptoms. Pertinent negatives for hypoglycemia include no confusion, headaches, pallor or seizures. Associated symptoms include blurred vision, fatigue, polydipsia and polyuria. Pertinent negatives for diabetes include no chest pain and no polyphagia. There are no hypoglycemic complications. Symptoms are worsening. Diabetic complications include nephropathy. Risk factors for coronary artery disease include dyslipidemia, diabetes mellitus, hypertension, post-menopausal and sedentary lifestyle. Current diabetic treatments: She is on Lantus 24 units a.m., 20 units p.m., metformin 500 mg p.o. twice daily, glipizide 10 mg p.o. twice daily. Her weight is fluctuating minimally. She is following a generally unhealthy diet. When asked about meal planning, she reported none. She has not had a previous visit with a dietitian. She rarely participates in exercise. Her overall blood glucose range is >200 mg/dl. (She presents with a book showing random blood glucose averaging greater than 250 mg per DL she is monitoring only once a day.  Her recent A1c was 12.1%.) An ACE inhibitor/angiotensin II receptor blocker is being taken.  ?Hyperlipidemia ?This is a chronic problem. The current episode started more than 1 year ago. Exacerbating diseases include chronic renal disease and diabetes. Pertinent negatives include no chest pain, myalgias or shortness of breath. Current antihyperlipidemic treatment includes statins. Risk factors for coronary artery disease include dyslipidemia, diabetes mellitus, family history, hypertension, a sedentary lifestyle and post-menopausal.  ?Hypertension ?This is a chronic problem. The current episode started more than  1 year ago. Associated symptoms include blurred vision. Pertinent negatives include no chest pain, headaches, palpitations or shortness of breath. Risk factors for coronary artery disease include dyslipidemia, diabetes mellitus, family history, sedentary lifestyle and post-menopausal state. Past treatments include angiotensin blockers. Identifiable causes of hypertension include chronic renal disease.  ? ? ?Review of Systems  ?Constitutional:  Positive for fatigue. Negative for chills, fever and unexpected weight change.  ?HENT:  Negative for trouble swallowing and voice change.   ?Eyes:  Positive for blurred vision. Negative for visual disturbance.  ?Respiratory:  Negative for cough, shortness of breath and wheezing.   ?Cardiovascular:  Negative for chest pain, palpitations and leg swelling.  ?Gastrointestinal:  Negative for diarrhea, nausea and vomiting.  ?Endocrine: Positive for polydipsia and polyuria.  Negative for cold intolerance, heat intolerance and polyphagia.  ?Musculoskeletal:  Negative for arthralgias and myalgias.  ?Skin:  Negative for color change, pallor, rash and wound.  ?Neurological:  Negative for seizures and headaches.  ?Psychiatric/Behavioral:  Negative for confusion and suicidal ideas.   ? ?Objective:  ?  ? ?  04/22/2022  ?  9:29 AM 12/09/2021  ?  9:13 AM 12/10/2019  ?  9:22 AM  ?Vitals with BMI  ?Height 5' 4.5" '5\' 7"'$  '5\' 7"'$   ?Weight 174 lbs 13 oz 183 lbs 183 lbs  ?BMI 29.55 28.65 28.65  ?Systolic 979 892 119  ?Diastolic 84 74 73  ?Pulse 76 67 55  ? ? ?BP 134/84   Pulse 76   Ht 5' 4.5" (1.638 m)   Wt 174 lb 12.8 oz (79.3 kg)   BMI 29.54 kg/m?   ?Wt Readings from Last 3 Encounters:  ?04/22/22 174 lb 12.8 oz (79.3 kg)  ?12/09/21 182 lb 15.7 oz (83 kg)  ?12/10/19 182 lb 15.7 oz (83 kg)  ?  ? ?Physical Exam ?Constitutional:   ?   Appearance: She is well-developed.  ?HENT:  ?   Head: Normocephalic and atraumatic.  ?Neck:  ?   Thyroid: No thyromegaly.  ?   Trachea: No tracheal deviation.   ?Cardiovascular:  ?   Rate and Rhythm: Normal rate and regular rhythm.  ?Pulmonary:  ?   Effort: Pulmonary effort is normal.  ?Abdominal:  ?   General: Bowel sounds are normal.  ?   Palpations: Abdomen is soft.  ?   [place]

## 2022-04-28 ENCOUNTER — Ambulatory Visit (HOSPITAL_COMMUNITY)
Admission: RE | Admit: 2022-04-28 | Discharge: 2022-04-28 | Disposition: A | Discharge status: Home / Self Care | Payer: Medicare Other | Source: Ambulatory Visit | Attending: Adult Health Nurse Practitioner | Admitting: Adult Health Nurse Practitioner

## 2022-04-28 DIAGNOSIS — M810 Age-related osteoporosis without current pathological fracture: Secondary | ICD-10-CM | POA: Insufficient documentation

## 2022-04-28 DIAGNOSIS — Z780 Asymptomatic menopausal state: Secondary | ICD-10-CM | POA: Diagnosis not present

## 2022-04-28 DIAGNOSIS — M85852 Other specified disorders of bone density and structure, left thigh: Secondary | ICD-10-CM | POA: Diagnosis not present

## 2022-05-04 ENCOUNTER — Ambulatory Visit: Payer: Medicare Other | Admitting: "Endocrinology

## 2022-05-04 DIAGNOSIS — Z20822 Contact with and (suspected) exposure to covid-19: Secondary | ICD-10-CM | POA: Diagnosis not present

## 2022-05-23 ENCOUNTER — Ambulatory Visit: Payer: Medicare Other | Admitting: Nutrition

## 2022-06-09 ENCOUNTER — Encounter (HOSPITAL_COMMUNITY): Payer: Medicare Other

## 2022-07-15 DIAGNOSIS — R300 Dysuria: Secondary | ICD-10-CM | POA: Diagnosis not present

## 2022-07-15 DIAGNOSIS — F410 Panic disorder [episodic paroxysmal anxiety] without agoraphobia: Secondary | ICD-10-CM | POA: Diagnosis not present

## 2022-08-11 ENCOUNTER — Encounter (HOSPITAL_COMMUNITY)
Admission: RE | Admit: 2022-08-11 | Discharge: 2022-08-11 | Disposition: A | Discharge status: Home / Self Care | Payer: Medicare Other | Source: Ambulatory Visit | Attending: Internal Medicine | Admitting: Internal Medicine

## 2022-08-11 ENCOUNTER — Other Ambulatory Visit: Payer: Self-pay

## 2022-08-11 DIAGNOSIS — M810 Age-related osteoporosis without current pathological fracture: Secondary | ICD-10-CM

## 2022-08-11 MED ORDER — DENOSUMAB 60 MG/ML ~~LOC~~ SOSY
60.0000 mg | PREFILLED_SYRINGE | Freq: Once | SUBCUTANEOUS | Status: AC
  Administered 2022-08-11: 60 mg via SUBCUTANEOUS

## 2022-08-11 NOTE — Addendum Note (Signed)
Encounter addended by: Terence Lux, RN on: 08/11/2022 11:27 AM  Actions taken: Charge Capture section accepted

## 2022-08-11 NOTE — Progress Notes (Signed)
Diagnosis: Osteoporosis  Provider:  Zack Hall MD  Procedure: Injection  Prolia (Denosumab), Dose: 60 mg, Site: subcutaneous, Number of injections: 1  Discharge: Condition: Good, Destination: Home . AVS provided to patient.   Performed by:  Madalen Gavin, RN        

## 2022-09-05 ENCOUNTER — Other Ambulatory Visit: Payer: Self-pay

## 2022-09-05 DIAGNOSIS — G43009 Migraine without aura, not intractable, without status migrainosus: Secondary | ICD-10-CM | POA: Diagnosis not present

## 2022-09-05 DIAGNOSIS — F411 Generalized anxiety disorder: Secondary | ICD-10-CM | POA: Diagnosis not present

## 2022-09-05 DIAGNOSIS — E782 Mixed hyperlipidemia: Secondary | ICD-10-CM | POA: Diagnosis not present

## 2022-09-05 DIAGNOSIS — Z23 Encounter for immunization: Secondary | ICD-10-CM | POA: Diagnosis not present

## 2022-09-05 DIAGNOSIS — E039 Hypothyroidism, unspecified: Secondary | ICD-10-CM | POA: Diagnosis not present

## 2022-09-05 DIAGNOSIS — R001 Bradycardia, unspecified: Secondary | ICD-10-CM | POA: Diagnosis not present

## 2022-09-05 DIAGNOSIS — E1165 Type 2 diabetes mellitus with hyperglycemia: Secondary | ICD-10-CM | POA: Diagnosis not present

## 2022-09-05 DIAGNOSIS — E559 Vitamin D deficiency, unspecified: Secondary | ICD-10-CM | POA: Diagnosis not present

## 2022-09-05 DIAGNOSIS — I10 Essential (primary) hypertension: Secondary | ICD-10-CM | POA: Diagnosis not present

## 2022-09-15 ENCOUNTER — Ambulatory Visit: Payer: Medicare Other | Attending: Adult Health Nurse Practitioner

## 2022-09-15 DIAGNOSIS — R001 Bradycardia, unspecified: Secondary | ICD-10-CM | POA: Insufficient documentation

## 2022-09-19 DIAGNOSIS — R001 Bradycardia, unspecified: Secondary | ICD-10-CM | POA: Diagnosis not present

## 2022-09-22 DIAGNOSIS — J209 Acute bronchitis, unspecified: Secondary | ICD-10-CM | POA: Diagnosis not present

## 2022-09-22 DIAGNOSIS — J069 Acute upper respiratory infection, unspecified: Secondary | ICD-10-CM | POA: Diagnosis not present

## 2022-09-22 DIAGNOSIS — E669 Obesity, unspecified: Secondary | ICD-10-CM | POA: Diagnosis not present

## 2022-09-22 DIAGNOSIS — Z6832 Body mass index (BMI) 32.0-32.9, adult: Secondary | ICD-10-CM | POA: Diagnosis not present

## 2022-09-30 DIAGNOSIS — R001 Bradycardia, unspecified: Secondary | ICD-10-CM | POA: Diagnosis not present

## 2022-10-03 DIAGNOSIS — J069 Acute upper respiratory infection, unspecified: Secondary | ICD-10-CM | POA: Diagnosis not present

## 2023-01-10 ENCOUNTER — Other Ambulatory Visit (HOSPITAL_COMMUNITY): Payer: Self-pay | Admitting: Adult Health Nurse Practitioner

## 2023-01-10 DIAGNOSIS — Z1231 Encounter for screening mammogram for malignant neoplasm of breast: Secondary | ICD-10-CM

## 2023-02-08 ENCOUNTER — Other Ambulatory Visit: Payer: Self-pay

## 2023-02-08 ENCOUNTER — Telehealth: Payer: Self-pay | Admitting: Pharmacy Technician

## 2023-02-08 DIAGNOSIS — M810 Age-related osteoporosis without current pathological fracture: Secondary | ICD-10-CM

## 2023-02-08 NOTE — Telephone Encounter (Addendum)
Auth Submission: NO AUTH NEEDED @[facility]  Payer: MEDICARE A/B - [facility] OPEN [place]ESS Medication & CPT/J Code(s) submitted: Prolia (Denosumab) [id number] Route of submission (phone, fax, portal):  Phone # Fax # Auth type: Buy/Bill Units/visits requested: X2 Reference number:  Approval from: 02/08/23 to 12/26/23

## 2023-02-09 ENCOUNTER — Encounter (HOSPITAL_COMMUNITY): Payer: Medicare Other

## 2023-02-13 ENCOUNTER — Encounter (HOSPITAL_COMMUNITY)
Admission: RE | Admit: 2023-02-13 | Discharge: 2023-02-13 | Disposition: A | Discharge status: Home / Self Care | Payer: Medicare Other | Source: Ambulatory Visit | Attending: Adult Health Nurse Practitioner | Admitting: Adult Health Nurse Practitioner

## 2023-02-13 VITALS — BP 163/81 | HR 66 | Temp 97.8°F | Resp 18

## 2023-02-13 DIAGNOSIS — M810 Age-related osteoporosis without current pathological fracture: Secondary | ICD-10-CM | POA: Diagnosis present

## 2023-02-13 MED ORDER — DENOSUMAB 60 MG/ML ~~LOC~~ SOSY
60.0000 mg | PREFILLED_SYRINGE | Freq: Once | SUBCUTANEOUS | Status: AC
  Administered 2023-02-13: 60 mg via SUBCUTANEOUS

## 2023-02-13 NOTE — Progress Notes (Signed)
Diagnosis: Osteoporosis  Provider:  Pablo Lawrence NP  Procedure: Injection  Prolia (Denosumab), Dose: 60 mg, Site: subcutaneous, Number of injections: 1  Post Care: Patient declined observation  Discharge: Condition: Good, Destination: Home . AVS Declined  Performed by:  Baxter Hire, RN

## 2023-02-16 ENCOUNTER — Ambulatory Visit (HOSPITAL_COMMUNITY): Payer: Medicare Other

## 2023-02-20 ENCOUNTER — Encounter (HOSPITAL_COMMUNITY): Payer: Self-pay

## 2023-02-20 ENCOUNTER — Ambulatory Visit (HOSPITAL_COMMUNITY)
Admission: RE | Admit: 2023-02-20 | Discharge: 2023-02-20 | Disposition: A | Discharge status: Home / Self Care | Payer: Medicare Other | Source: Ambulatory Visit | Attending: Adult Health Nurse Practitioner | Admitting: Adult Health Nurse Practitioner

## 2023-02-20 DIAGNOSIS — Z1231 Encounter for screening mammogram for malignant neoplasm of breast: Secondary | ICD-10-CM | POA: Diagnosis present

## 2023-02-22 ENCOUNTER — Other Ambulatory Visit: Payer: Self-pay

## 2023-03-27 ENCOUNTER — Other Ambulatory Visit: Payer: Self-pay

## 2023-03-27 ENCOUNTER — Ambulatory Visit
Admission: EM | Admit: 2023-03-27 | Discharge: 2023-03-27 | Disposition: A | Discharge status: Home / Self Care | Payer: Medicare Other | Attending: Nurse Practitioner | Admitting: Nurse Practitioner

## 2023-03-27 ENCOUNTER — Ambulatory Visit (INDEPENDENT_AMBULATORY_CARE_PROVIDER_SITE_OTHER): Payer: Medicare Other

## 2023-03-27 DIAGNOSIS — R0602 Shortness of breath: Secondary | ICD-10-CM

## 2023-03-27 DIAGNOSIS — R0789 Other chest pain: Secondary | ICD-10-CM | POA: Diagnosis present

## 2023-03-27 DIAGNOSIS — R5383 Other fatigue: Secondary | ICD-10-CM

## 2023-03-27 DIAGNOSIS — J069 Acute upper respiratory infection, unspecified: Secondary | ICD-10-CM

## 2023-03-27 DIAGNOSIS — Z1152 Encounter for screening for COVID-19: Secondary | ICD-10-CM | POA: Diagnosis present

## 2023-03-27 MED ORDER — BENZONATATE 100 MG PO CAPS: 100.0000 mg | ORAL_CAPSULE | Freq: Three times a day (TID) | ORAL | 0 refills | Status: AC | PRN

## 2023-03-27 NOTE — ED Provider Notes (Signed)
[facility]    CSN: [id number] Arrival date & time: 03/27/23  0913      History   Chief Complaint Chief Complaint  Patient presents with   Fatigue   Sore Throat    HPI DARIYAN THAMMAVONG is a 77 y.o. female.   Patient presents for 5-day history of chills, dry cough, chest tightness from coughing, shortness of breath when she is up and moving around, chest and nasal congestion, runny nose, watery eyes, sneezing, postnasal drainage, sore throat from coughing, headache, left-sided ear pain, diarrhea, decreased appetite, lack of taste, and fatigue.  She denies fever, body aches, congested cough, chest pain, abdominal pain, nausea and vomiting.  Reports her appetite has been decreased and symptom started.  No known sick contacts.  Has been taking Tylenol, cough drops, and Chloraseptic spray without much benefit.  Patient denies smoking history.  Denies history of asthma or chronic lung disease.    Past Medical History:  Diagnosis Date   Diverticulosis    DM (diabetes mellitus)    Esophageal stricture    Essential hypertension, benign 04/22/2022   Gastritis    GERD (gastroesophageal reflux disease)    Heart valve problem    Hypercholesterolemia    can't take meds, affects LFTs   Hypothyroidism    OSA on CPAP    Poorly controlled type 2 diabetes mellitus 04/22/2022    Patient Active Problem List   Diagnosis Date Noted   OP (osteoporosis) 02/08/2023   Poorly controlled type 2 diabetes mellitus 04/22/2022   Mixed hyperlipidemia 04/22/2022   Essential hypertension, benign 04/22/2022   Chronic diastolic heart failure [id number]   DOE (dyspnea on exertion)    Chest pain 04/26/2014   Hypothyroidism 04/26/2014   Headache 04/26/2014   GERD (gastroesophageal reflux disease) 04/26/2014   OSA on CPAP 04/26/2014   LUQ pain 09/13/2011    Past Surgical History:  Procedure Laterality Date   APPENDECTOMY     BREAST BIOPSY Left 1990   benign   CHOLECYSTECTOMY      ESOPHAGOGASTRODUODENOSCOPY  09/28/2011   mild gastritis/stricture in the distal esophagus/small gastric polyps benign   LEFT HEART CATH AND CORONARY ANGIOGRAPHY N/A 08/22/2017   Procedure: LEFT HEART CATH AND CORONARY ANGIOGRAPHY;  Surgeon: Belva Crome, MD;  Location: [facility];  Service: Cardiovascular;  Laterality: N/A;   THYROID SURGERY  12/26/2006   left  thyroid removed   TOTAL ABDOMINAL HYSTERECTOMY     TUBAL LIGATION     YAG LASER APPLICATION Right 0000000   Procedure: YAG LASER APPLICATION;  Surgeon: Rutherford Guys, MD;  Location: [facility];  Service: Ophthalmology;  Laterality: Right;   YAG LASER APPLICATION Left [id number]   Procedure: YAG LASER APPLICATION;  Surgeon: Rutherford Guys, MD;  Location: [facility];  Service: Ophthalmology;  Laterality: Left;    OB History   No obstetric history on file.      Home Medications    Prior to Admission medications   Medication Sig Start Date End Date Taking? Authorizing Provider  ALPRAZolam Duanne Moron) 1 MG tablet Take 1 mg by mouth at bedtime as needed for anxiety.   Yes [provider]  benzonatate (TESSALON) 100 MG capsule Take 1 capsule (100 mg total) by mouth 3 (three) times daily as needed for cough. Do not take with alcohol or while driving or operating heavy machinery.  May cause drowsiness. 03/27/23  Yes Eulogio Bear, NP  Cholecalciferol (VITAMIN D3) 5000 units TABS Take by mouth.   Yes  [provider]  insulin glargine (LANTUS) 100 UNIT/ML Solostar Pen Inject 40 Units into the skin at bedtime. 03/23/22  Yes [provider]  levothyroxine (SYNTHROID, LEVOTHROID) 75 MCG tablet Take 75 mcg by mouth daily before breakfast.   Yes [provider]  metFORMIN (GLUCOPHAGE) 500 MG tablet Take 500 mg by mouth daily after breakfast.   Yes [provider]  omeprazole (PRILOSEC) 40 MG capsule Take by mouth daily as needed. 03/23/22  Yes [provider]  simvastatin (ZOCOR) 20 MG  tablet Take 20 mg by mouth every other day.   Yes [provider]  Cyanocobalamin (VITAMIN B-12 PO) Take by mouth daily.    [provider]  denosumab (PROLIA) 60 MG/ML SOSY injection Inject 60 mg into the skin every 6 (six) months.    Celene Squibb, MD  esomeprazole (NEXIUM) 40 MG capsule Take 40 mg by mouth daily as needed (acid reflux).     [provider]  glipiZIDE (GLUCOTROL) 10 MG tablet Take 5 mg by mouth daily with breakfast. 01/31/22   [provider]  losartan (COZAAR) 50 MG tablet Take 1 tablet (50 mg total) by mouth daily. 03/04/19 06/02/19  Herminio Commons, MD    Family History Family History  Problem Relation Age of Onset   Hypertension Mother    Colon cancer Mother        in remission, diagnosed age 56   Hyperlipidemia Mother    Anesthesia problems Neg Hx    Hypotension Neg Hx    Malignant hyperthermia Neg Hx    Pseudochol deficiency Neg Hx     Social History Social History   Tobacco Use   Smoking status: Never   Smokeless tobacco: Never  Vaping Use   Vaping Use: Never used  Substance Use Topics   Alcohol use: No    Alcohol/week: 0.0 standard drinks of alcohol   Drug use: No     Allergies   Patient has no known allergies.   Review of Systems Review of Systems Per HPI  Physical Exam Triage Vital Signs ED Triage Vitals [03/27/23 0926]  Enc Vitals Group     BP (!) 152/89     Pulse Rate 73     Resp 20     Temp 98 F (36.7 C)     Temp Source Oral     SpO2 94 %     Weight      Height      Head Circumference      Peak Flow      Pain Score 4     Pain Loc      Pain Edu?      Excl. in [place]?    No data found.  Updated Vital Signs BP (!) 152/89 (BP Location: Right Arm)   Pulse 73   Temp 98 F (36.7 C) (Oral)   Resp 20   SpO2 94%   Oxygen saturation recheck: 97% on room air  Visual Acuity Right Eye Distance:   Left Eye Distance:   Bilateral Distance:    Right Eye Near:   Left Eye Near:    Bilateral  Near:     Physical Exam Vitals and nursing note reviewed.  Constitutional:      General: She is not in acute distress.    Appearance: Normal appearance. She is not ill-appearing or toxic-appearing.  HENT:     Head: Normocephalic and atraumatic.     Right Ear: Tympanic membrane and external ear normal.  Left Ear: Tympanic membrane and external ear normal.     Ears:     Comments: Bilateral impacted cerumen; approximately 10% of tympanic membrane visualized bilaterally nonerythematous, nonbulging    Nose: No congestion or rhinorrhea.     Mouth/Throat:     Mouth: Mucous membranes are moist.     Pharynx: Oropharynx is clear. No oropharyngeal exudate or posterior oropharyngeal erythema.     Tonsils: No tonsillar exudate. 0 on the right. 0 on the left.  Eyes:     General: No scleral icterus.    Extraocular Movements: Extraocular movements intact.  Cardiovascular:     Rate and Rhythm: Normal rate and regular rhythm.  Pulmonary:     Effort: Pulmonary effort is normal. No respiratory distress.     Breath sounds: Normal breath sounds. No wheezing, rhonchi or rales.  Abdominal:     General: Abdomen is flat. Bowel sounds are normal. There is no distension.     Palpations: Abdomen is soft.  Musculoskeletal:     Cervical back: Normal range of motion and neck supple.  Lymphadenopathy:     Cervical: No cervical adenopathy.  Skin:    General: Skin is warm and dry.     Coloration: Skin is not jaundiced or pale.     Findings: No erythema or rash.  Neurological:     Mental Status: She is alert and oriented to person, place, and time.     Motor: No weakness.  Psychiatric:        Behavior: Behavior is cooperative.      UC Treatments / Results  Labs (all labs ordered are listed, but only abnormal results are displayed) Labs Reviewed  SARS CORONAVIRUS 2 (TAT 6-24 HRS)    EKG   Radiology DG Chest 2 View  Result Date: 03/27/2023 CLINICAL DATA:  Fatigue and short of breath EXAM:  CHEST - 2 VIEW COMPARISON:  None Available. FINDINGS: Normal mediastinum and cardiac silhouette. Normal pulmonary vasculature. No evidence of effusion, infiltrate, or pneumothorax. No acute bony abnormality. IMPRESSION: No acute cardiopulmonary process. Electronically Signed   By: Suzy Bouchard M.D.   On: 03/27/2023 10:03    Procedures Procedures (including critical care time)  Medications Ordered in UC Medications - No data to display  Initial Impression / Assessment and Plan / UC Course  I have reviewed the triage vital signs and the nursing notes.  Pertinent labs & imaging results that were available during my care of the patient were reviewed by me and considered in my medical decision making (see chart for details).   Patient is well-appearing, normotensive, afebrile, not tachycardic, not tachypneic, oxygenating well on room air.    1. Viral URI with cough 2. Encounter for screening for COVID-19 Suspect viral etiology Vital signs and examination today are reassuring Chest x-ray today is negative for acute cardiopulmonary process COVID-19 test is pending Supportive care discussed with patient ER and return precautions also discussed  3. Chest tightness Chest x-ray today is negative Vital signs and examination are reassuring Patient is moving air well in bilateral lung fields; lungs are clear to auscultation bilaterally EKG today shows normal sinus rhythm with left bundle branch block; this does appear to be a new finding from prior EKG in 2020 No ST segment or T wave elevation or depressions I recommended follow-up with cardiologist ASAP Low suspicion for acute ACS as cause of chest tightness given recent URI - heart score is 4 today without troponin factored in Strict ER precautions discussed with patient  The patient was given the opportunity to ask questions.  All questions answered to their satisfaction.  The patient is in agreement to this plan.    Final Clinical  Impressions(s) / UC Diagnoses   Final diagnoses:  Viral URI with cough  Encounter for screening for COVID-19  Chest tightness     Discharge Instructions      You have a viral upper respiratory infection.  Symptoms should improve over the next few days.  If you develop worsening chest pain or shortness of breath, go to the emergency room.  We have tested you today for COVID-19.  You will see the results in Mychart and we will call you with positive results.  Please stay home and isolate until you are aware of the results.    Some things that can make you feel better are: - Increased rest - Increasing fluid with water/sugar free electrolytes - Acetaminophen and ibuprofen as needed for fever/pain - Salt water gargling, chloraseptic spray and throat lozenges - OTC guaifenesin (Mucinex) 600 mg twice daily - Saline sinus flushes or a neti pot - Humidifying the air -Tessalon Perles during the day as needed for dry cough  As we discussed, please follow up with a cardiologist     ED Prescriptions     Medication Sig Dispense Auth. Provider   benzonatate (TESSALON) 100 MG capsule Take 1 capsule (100 mg total) by mouth 3 (three) times daily as needed for cough. Do not take with alcohol or while driving or operating heavy machinery.  May cause drowsiness. 21 capsule Eulogio Bear, NP      PDMP not reviewed this encounter.   Eulogio Bear, NP 03/27/23 1131

## 2023-03-27 NOTE — Discharge Instructions (Signed)
You have a viral upper respiratory infection.  Symptoms should improve over the next few days.  If you develop worsening chest pain or shortness of breath, go to the emergency room.  We have tested you today for COVID-19.  You will see the results in Mychart and we will call you with positive results.  Please stay home and isolate until you are aware of the results.    Some things that can make you feel better are: - Increased rest - Increasing fluid with water/sugar free electrolytes - Acetaminophen and ibuprofen as needed for fever/pain - Salt water gargling, chloraseptic spray and throat lozenges - OTC guaifenesin (Mucinex) 600 mg twice daily - Saline sinus flushes or a neti pot - Humidifying the air -Tessalon Perles during the day as needed for dry cough  As we discussed, please follow up with a cardiologist

## 2023-03-27 NOTE — ED Triage Notes (Signed)
Cough, sore throat , fatigue, SOB, body aches, decreased appetite, pt states she is having heart palpations with chest tightness/ pressure that started 1 week ago. Taking Tylenol with no relief.

## 2023-03-28 LAB — SARS CORONAVIRUS 2 (TAT 6-24 HRS): SARS Coronavirus 2: NEGATIVE

## 2023-08-10 ENCOUNTER — Encounter: Payer: Medicare Other | Attending: Adult Health Nurse Practitioner | Admitting: *Deleted

## 2023-08-10 ENCOUNTER — Other Ambulatory Visit (HOSPITAL_COMMUNITY): Payer: Self-pay | Admitting: Adult Health Nurse Practitioner

## 2023-08-10 ENCOUNTER — Encounter (HOSPITAL_COMMUNITY)
Admission: RE | Admit: 2023-08-10 | Payer: Medicare Other | Source: Ambulatory Visit | Attending: Internal Medicine | Admitting: Internal Medicine

## 2023-08-10 VITALS — BP 153/85 | HR 58 | Temp 97.7°F | Resp 18

## 2023-08-10 DIAGNOSIS — R2242 Localized swelling, mass and lump, left lower limb: Secondary | ICD-10-CM

## 2023-08-10 DIAGNOSIS — Z79620 Long term (current) use of immunosuppressive biologic: Secondary | ICD-10-CM | POA: Insufficient documentation

## 2023-08-10 DIAGNOSIS — M810 Age-related osteoporosis without current pathological fracture: Secondary | ICD-10-CM | POA: Insufficient documentation

## 2023-08-10 MED ORDER — DENOSUMAB 60 MG/ML ~~LOC~~ SOSY
60.0000 mg | PREFILLED_SYRINGE | Freq: Once | SUBCUTANEOUS | Status: AC
  Administered 2023-08-10: 60 mg via SUBCUTANEOUS

## 2023-08-10 NOTE — Progress Notes (Signed)
Diagnosis: Osteoporosis  Provider:  Roe Rutherford NP  Procedure: Injection  Prolia (Denosumab), Dose: 60 mg, Site: subcutaneous, Number of injections: 1  Post Care: Observation period completed  Discharge: Condition: Good, Destination: Home . AVS Provided  Performed by:  Daleen Squibb, RN

## 2023-08-14 ENCOUNTER — Ambulatory Visit (HOSPITAL_COMMUNITY)
Admission: RE | Admit: 2023-08-14 | Discharge: 2023-08-14 | Disposition: A | Discharge status: Home / Self Care | Payer: Medicare Other | Source: Ambulatory Visit | Attending: Adult Health Nurse Practitioner | Admitting: Adult Health Nurse Practitioner

## 2023-08-14 DIAGNOSIS — R2242 Localized swelling, mass and lump, left lower limb: Secondary | ICD-10-CM | POA: Diagnosis not present

## 2023-09-02 ENCOUNTER — Encounter (HOSPITAL_COMMUNITY): Payer: Self-pay

## 2023-09-02 ENCOUNTER — Emergency Department (HOSPITAL_COMMUNITY)
Admission: EM | Admit: 2023-09-02 | Discharge: 2023-09-02 | Disposition: A | Discharge status: Home / Self Care | Payer: Medicare Other | Attending: Emergency Medicine | Admitting: Emergency Medicine

## 2023-09-02 ENCOUNTER — Emergency Department (HOSPITAL_COMMUNITY): Payer: Medicare Other

## 2023-09-02 ENCOUNTER — Other Ambulatory Visit: Payer: Self-pay

## 2023-09-02 DIAGNOSIS — Z79899 Other long term (current) drug therapy: Secondary | ICD-10-CM | POA: Diagnosis not present

## 2023-09-02 DIAGNOSIS — E119 Type 2 diabetes mellitus without complications: Secondary | ICD-10-CM | POA: Diagnosis not present

## 2023-09-02 DIAGNOSIS — R600 Localized edema: Secondary | ICD-10-CM | POA: Diagnosis present

## 2023-09-02 DIAGNOSIS — I10 Essential (primary) hypertension: Secondary | ICD-10-CM | POA: Diagnosis not present

## 2023-09-02 DIAGNOSIS — R0602 Shortness of breath: Secondary | ICD-10-CM | POA: Insufficient documentation

## 2023-09-02 DIAGNOSIS — Z794 Long term (current) use of insulin: Secondary | ICD-10-CM | POA: Diagnosis not present

## 2023-09-02 DIAGNOSIS — Z7984 Long term (current) use of oral hypoglycemic drugs: Secondary | ICD-10-CM | POA: Insufficient documentation

## 2023-09-02 DIAGNOSIS — E039 Hypothyroidism, unspecified: Secondary | ICD-10-CM | POA: Diagnosis not present

## 2023-09-02 LAB — CBC WITH DIFFERENTIAL/PLATELET
Abs Immature Granulocytes: 0.03 10*3/uL (ref 0.00–0.07)
Basophils Absolute: 0.1 10*3/uL (ref 0.0–0.1)
Basophils Relative: 1 %
Eosinophils Absolute: 0.4 10*3/uL (ref 0.0–0.5)
Eosinophils Relative: 6 %
HCT: 31.9 % — ABNORMAL LOW (ref 36.0–46.0)
Hemoglobin: 9.8 g/dL — ABNORMAL LOW (ref 12.0–15.0)
Immature Granulocytes: 0 %
Lymphocytes Relative: 17 %
Lymphs Abs: 1.3 10*3/uL (ref 0.7–4.0)
MCH: 27.9 pg (ref 26.0–34.0)
MCHC: 30.7 g/dL (ref 30.0–36.0)
MCV: 90.9 fL (ref 80.0–100.0)
Monocytes Absolute: 0.6 10*3/uL (ref 0.1–1.0)
Monocytes Relative: 8 %
Neutro Abs: 5.2 10*3/uL (ref 1.7–7.7)
Neutrophils Relative %: 68 %
Platelets: 317 10*3/uL (ref 150–400)
RBC: 3.51 MIL/uL — ABNORMAL LOW (ref 3.87–5.11)
RDW: 13.7 % (ref 11.5–15.5)
WBC: 7.6 10*3/uL (ref 4.0–10.5)
nRBC: 0 % (ref 0.0–0.2)

## 2023-09-02 LAB — COMPREHENSIVE METABOLIC PANEL
ALT: 17 U/L (ref 0–44)
AST: 21 U/L (ref 15–41)
Albumin: 3.3 g/dL — ABNORMAL LOW (ref 3.5–5.0)
Alkaline Phosphatase: 102 U/L (ref 38–126)
Anion gap: 6 (ref 5–15)
BUN: 24 mg/dL — ABNORMAL HIGH (ref 8–23)
CO2: 26 mmol/L (ref 22–32)
Calcium: 8.5 mg/dL — ABNORMAL LOW (ref 8.9–10.3)
Chloride: 105 mmol/L (ref 98–111)
Creatinine, Ser: 1.21 mg/dL — ABNORMAL HIGH (ref 0.44–1.00)
GFR, Estimated: 46 mL/min — ABNORMAL LOW (ref >60)
Glucose, Bld: 154 mg/dL — ABNORMAL HIGH (ref 70–99)
Potassium: 4.6 mmol/L (ref 3.5–5.1)
Sodium: 137 mmol/L (ref 135–145)
Total Bilirubin: 0.7 mg/dL (ref 0.3–1.2)
Total Protein: 6.5 g/dL (ref 6.5–8.1)

## 2023-09-02 LAB — BRAIN NATRIURETIC PEPTIDE: B Natriuretic Peptide: 105 pg/mL — ABNORMAL HIGH (ref 0.0–100.0)

## 2023-09-02 MED ORDER — FUROSEMIDE 10 MG/ML IJ SOLN
40.0000 mg | Freq: Once | INTRAMUSCULAR | Status: DC
  Filled 2023-09-02: qty 4

## 2023-09-02 MED ORDER — MEDICAL COMPRESSION STOCKINGS MISC: 1.0000 | Freq: Every day | Status: AC | PRN

## 2023-09-02 MED ORDER — FUROSEMIDE 10 MG/ML IJ SOLN
40.0000 mg | Freq: Once | INTRAMUSCULAR | Status: AC
  Administered 2023-09-02: 40 mg via INTRAMUSCULAR

## 2023-09-02 NOTE — ED Provider Notes (Signed)
[place] EMERGENCY DEPARTMENT AT [facility] Provider Note   CSN: [id number] Arrival date & time: 09/02/23  2595     History  Chief Complaint  Patient presents with   Leg Swelling    [name] is a 77 y.o. female with a history significant for type 2 diabetes, GERD, hypertension sleep apnea who uses CPAP, hypothyroidism presenting for evaluation of worsening peripheral edema and shortness of breath.  She states that mid July she underwent some dental extractions, required antibiotics and about 4 days after this event she started to develop daily swelling in her feet and ankles.  She was seen by her PCP in August at which time her losartan was discontinued and she was put on Lasix 40 mg daily which she has been compliant with.  She also had bilateral Doppler ultrasounds to rule out DVT which was negative and is anticipating an outpatient echo.  She is here today secondary to worsening swelling, now extending to her knees bilaterally.  She has difficulty walking secondary to leg discomfort.  She also endorses shortness of breath with exertion, she never lies flat to sleep but has recognized increased shortness of breath generally speaking.  No fevers, denies chest pain, palpitations cough.  No recent URI type illnesses.  The history is provided by the patient.       Home Medications Prior to Admission medications   Medication Sig Start Date End Date Taking? Authorizing Provider  Elastic Bandages & Supports (MEDICAL COMPRESSION STOCKINGS) MISC 1 each by Does not apply route daily as needed. 09/02/23  Yes Breawna Montenegro, Raynelle Fanning, PA-C  ALPRAZolam Prudy Feeler) 1 MG tablet Take 1 mg by mouth at bedtime as needed for anxiety.    [provider]  benzonatate (TESSALON) 100 MG capsule Take 1 capsule (100 mg total) by mouth 3 (three) times daily as needed for cough. Do not take with alcohol or while driving or operating heavy machinery.  May cause drowsiness. 03/27/23   Valentino Nose, NP   Cholecalciferol (VITAMIN D3) 5000 units TABS Take by mouth.    [provider]  Cyanocobalamin (VITAMIN B-12 PO) Take by mouth daily.    [provider]  denosumab (PROLIA) 60 MG/ML SOSY injection Inject 60 mg into the skin every 6 (six) months.    Benita Stabile, MD  esomeprazole (NEXIUM) 40 MG capsule Take 40 mg by mouth daily as needed (acid reflux).     [provider]  glipiZIDE (GLUCOTROL) 10 MG tablet Take 5 mg by mouth daily with breakfast. 01/31/22   [provider]  insulin glargine (LANTUS) 100 UNIT/ML Solostar Pen Inject 40 Units into the skin at bedtime. 03/23/22   [provider]  levothyroxine (SYNTHROID, LEVOTHROID) 75 MCG tablet Take 75 mcg by mouth daily before breakfast.    [provider]  losartan (COZAAR) 50 MG tablet Take 1 tablet (50 mg total) by mouth daily. 03/04/19 06/02/19  Laqueta Linden, MD  metFORMIN (GLUCOPHAGE) 500 MG tablet Take 500 mg by mouth daily after breakfast.    [provider]  omeprazole (PRILOSEC) 40 MG capsule Take by mouth daily as needed. 03/23/22   [provider]  simvastatin (ZOCOR) 20 MG tablet Take 20 mg by mouth every other day.    [provider]      Allergies    Patient has no known allergies.    Review of Systems   Review of Systems  Constitutional:  Negative for fever.  HENT:  Negative  for congestion and sore throat.   Eyes: Negative.   Respiratory:  Positive for shortness of breath. Negative for chest tightness.   Cardiovascular:  Positive for leg swelling. Negative for chest pain.  Gastrointestinal:  Negative for abdominal pain, nausea and vomiting.  Genitourinary: Negative.   Musculoskeletal:  Negative for arthralgias, joint swelling and neck pain.  Skin: Negative.  Negative for rash and wound.  Neurological:  Negative for dizziness, weakness, light-headedness, numbness and headaches.  Psychiatric/Behavioral: Negative.      Physical Exam Updated  Vital Signs BP (!) 142/68   Pulse (!) 58   Temp 98.6 F (37 C) (Oral)   Resp (!) 31   Ht 5' 4.5" (1.638 m)   Wt 72.6 kg   SpO2 94%   BMI 27.04 kg/m  Physical Exam Vitals and nursing note reviewed.  Constitutional:      Appearance: She is well-developed.  HENT:     Head: Normocephalic and atraumatic.  Eyes:     Conjunctiva/sclera: Conjunctivae normal.  Cardiovascular:     Rate and Rhythm: Normal rate and regular rhythm.     Heart sounds: Normal heart sounds.     Comments: Pitting edema noted to bilateral lower tibia with trace edema proximal tibia. no significant skin changes, induration or erythema.  Dorsalis pedal pulses are present. Pulmonary:     Effort: Pulmonary effort is normal.     Breath sounds: Examination of the right-lower field reveals rales. Examination of the left-lower field reveals rales. Rales present. No wheezing.     Comments: Trace rales bases Abdominal:     General: Bowel sounds are normal.     Palpations: Abdomen is soft.     Tenderness: There is no abdominal tenderness.  Musculoskeletal:        General: Normal range of motion.     Cervical back: Normal range of motion.     Right lower leg: Pitting Edema present.     Left lower leg: Pitting Edema present.  Skin:    General: Skin is warm and dry.  Neurological:     Mental Status: She is alert.     ED Results / Procedures / Treatments   Labs (all labs ordered are listed, but only abnormal results are displayed) Labs Reviewed  COMPREHENSIVE METABOLIC PANEL - Abnormal; Notable for the following components:      Result Value   Glucose, Bld 154 (*)    BUN 24 (*)    Creatinine, Ser 1.21 (*)    Calcium 8.5 (*)    Albumin 3.3 (*)    GFR, Estimated 46 (*)    All other components within normal limits  BRAIN NATRIURETIC PEPTIDE - Abnormal; Notable for the following components:   B Natriuretic Peptide 105.0 (*)    All other components within normal limits  CBC WITH DIFFERENTIAL/PLATELET - Abnormal;  Notable for the following components:   RBC 3.51 (*)    Hemoglobin 9.8 (*)    HCT 31.9 (*)    All other components within normal limits    EKG EKG Interpretation Date/Time:  Saturday September 02 2023 14:17:36 EDT Ventricular Rate:  62 PR Interval:  190 QRS Duration:  141 QT Interval:  424 QTC Calculation: 431 R Axis:   -21  Text Interpretation: Sinus arrhythmia Left bundle branch block No acute changes No significant change since last tracing Confirmed by Derwood Kaplan [id number]) on 09/02/2023 3:22:02 PM  Radiology DG Chest Portable 1 View  Result Date: 09/02/2023 CLINICAL DATA:  sob, peripheral edema  EXAM: PORTABLE CHEST - 1 VIEW COMPARISON:  03/27/2023 FINDINGS: Lungs are clear. Heart size upper limits normal. No effusion. Surgical clips at the thoracic inlet. IMPRESSION: No acute findings. Electronically Signed   By: Corlis Leak M.D.   On: 09/02/2023 15:40    Procedures Procedures    Medications Ordered in ED Medications  furosemide (LASIX) injection 40 mg (40 mg Intramuscular Given 09/02/23 1636)    ED Course/ Medical Decision Making/ A&P                                 Medical Decision Making Pt presenting with peripheral edema worsening over about 6 weeks, sob with exertion, unknown orthopnea - does not lie flat when sleeping due to cpap.  She has been on lasix 40 mg every day with worsening lower extremity edema.    Imaging and labs are reassuring, patient was ambulated in the department and had no hypoxia with ambulation.  She has a borderline normal BNP at 105, her chest x-ray does not suggest fluid overload/CHF.  She has discussed with her PCP the symptoms and is supposed to have an outpatient echocardiogram to rule out CHF as a diagnosis, she is awaiting this test currently.  By today's workup this appears to be a peripheral vascular source at this time.  She is encouraged compression stockings and a prescription was given for this.  We discussed elevation of her legs is  much as is reasonable to help with swelling.  She was given 1 IM dose of Lasix 40 mg here to help further diurese.  Plan close follow-up with her primary provider.  Amount and/or Complexity of Data Reviewed Labs: ordered.    Details: BNP is 105, c-Met is relatively stable, she has a glucose of 154 consistent with her diabetes, her BUN is 24, her creatinine is 1.21, her average creatinine has been 1.12-1.19. Radiology: ordered.    Details: Chest x-ray is negative for fluid overload, negative effusion. ECG/medicine tests: ordered and independent interpretation performed.    Details: Rate 62, sinus arrhythmia with left bundle branch block, unchanged.  Risk Prescription drug management.           Final Clinical Impression(s) / ED Diagnoses Final diagnoses:  Peripheral edema    Rx / DC Orders ED Discharge Orders          Ordered    Elastic Bandages & Supports (MEDICAL COMPRESSION STOCKINGS) MISC  Daily PRN        09/02/23 1628              Burgess Amor, PA-C 09/02/23 1845    Derwood Kaplan, MD 09/03/23 [phone]

## 2023-09-02 NOTE — Discharge Instructions (Signed)
Your lab test and checks x-ray today are reassuring, this does not appear to be congestive heart failure but rather a peripheral leg swelling issue.  However as discussed you do need to have the echocardiogram of your heart which you have discussed with your primary MD, call on Monday to make sure this gets scheduled.  In the meantime I have given you a prescription for a pair of compression stockings to wear during the day when you are active, this will help prevent return of fluid in your legs as will elevating your legs is much as is comfortable when you are sitting and lying.  I also agree with plans for seeing a vascular specialist, you may want to consider getting on their cancellation list if they have 1, sometimes this will get you into their office sooner than your October appointment.  You have received an extra dose of Lasix here, I expect that you will urinate a lot when you get home.  Continue taking your daily dose of Lasix, next dose tomorrow morning.  I recommend taking your prescription to [facility] as I suspect they have the ability to measure you for a fitted pair of stockings, if they are unable I would look for a medical supply store.

## 2023-09-02 NOTE — ED Notes (Signed)
Pt up and ambulated with pulse ox saturations stayed between 92-96%.

## 2023-09-02 NOTE — ED Triage Notes (Signed)
"  Had teeth taken out July 17th and my feet and legs have been swelling every since. I saw my PCP and was put on antibiotics and fluid pills and it is not helping. I tried to get back in to see her because the pain in my legs but she is out of the office, so I came here" per pt

## 2023-09-07 ENCOUNTER — Encounter: Payer: Self-pay | Admitting: Vascular Surgery

## 2023-09-07 ENCOUNTER — Ambulatory Visit (INDEPENDENT_AMBULATORY_CARE_PROVIDER_SITE_OTHER): Payer: Medicare Other | Admitting: Vascular Surgery

## 2023-09-07 VITALS — BP 142/76 | HR 92 | Temp 98.1°F | Resp 20 | Ht 64.5 in | Wt 188.0 lb

## 2023-09-07 DIAGNOSIS — M7989 Other specified soft tissue disorders: Secondary | ICD-10-CM

## 2023-09-07 NOTE — Progress Notes (Signed)
Patient ID: [name], female   DOB: [birth year], 77 y.o.   MRN: [id number]  Reason for Consult: New Patient (Initial Visit)   Referred by Roe Rutherford, NP  Subjective:     HPI:  [name] is a 77 y.o. female without significant history of vascular disease.  She does have hypertension, diabetes, hyperlipidemia.  In the past couple months she has developed significant swelling from her knees down bilaterally.  She states that she never had this previously.  She has been started on Lasix with only minimal relief.  She states that she elevates her legs but this does not help and she also stays active walking.  She has not had any tissue loss or ulceration.  No personal or family history of DVT.  She has not worn compression socks.  Past Medical History:  Diagnosis Date   Diverticulosis    DM (diabetes mellitus) (HCC)    Esophageal stricture    Essential hypertension, benign 04/22/2022   Gastritis    GERD (gastroesophageal reflux disease)    Heart valve problem    Hypercholesterolemia    can't take meds, affects LFTs   Hypothyroidism    OSA on CPAP    Poorly controlled type 2 diabetes mellitus (HCC) 04/22/2022   Family History  Problem Relation Age of Onset   Hypertension Mother    Colon cancer Mother        in remission, diagnosed age 15   Hyperlipidemia Mother    Anesthesia problems Neg Hx    Hypotension Neg Hx    Malignant hyperthermia Neg Hx    Pseudochol deficiency Neg Hx    Past Surgical History:  Procedure Laterality Date   APPENDECTOMY     BREAST BIOPSY Left 1990   benign   CHOLECYSTECTOMY     ESOPHAGOGASTRODUODENOSCOPY  09/28/2011   mild gastritis/stricture in the distal esophagus/small gastric polyps benign   LEFT HEART CATH AND CORONARY ANGIOGRAPHY N/A 08/22/2017   Procedure: LEFT HEART CATH AND CORONARY ANGIOGRAPHY;  Surgeon: Lyn Records, MD;  Location: [facility];  Service: Cardiovascular;  Laterality: N/A;   THYROID SURGERY  12/26/2006    left  thyroid removed   TOTAL ABDOMINAL HYSTERECTOMY     TUBAL LIGATION     YAG LASER APPLICATION Right 02/21/2017   Procedure: YAG LASER APPLICATION;  Surgeon: Jethro Bolus, MD;  Location: [facility];  Service: Ophthalmology;  Laterality: Right;   YAG LASER APPLICATION Left 04/18/2017   Procedure: YAG LASER APPLICATION;  Surgeon: Jethro Bolus, MD;  Location: [facility];  Service: Ophthalmology;  Laterality: Left;    Short Social History:  Social History   Tobacco Use   Smoking status: Never   Smokeless tobacco: Never  Substance Use Topics   Alcohol use: No    Alcohol/week: 0.0 standard drinks of alcohol    No Known Allergies  Current Outpatient Medications  Medication Sig Dispense Refill   ALPRAZolam (XANAX) 1 MG tablet Take 1 mg by mouth at bedtime as needed for anxiety.     Cholecalciferol (VITAMIN D3) 5000 units TABS Take by mouth.     Cyanocobalamin (VITAMIN B-12 PO) Take by mouth daily.     denosumab (PROLIA) 60 MG/ML SOSY injection Inject 60 mg into the skin every 6 (six) months.     Elastic Bandages & Supports (MEDICAL COMPRESSION STOCKINGS) MISC 1 each by Does not apply route daily as needed. 1 each PAIR   esomeprazole (NEXIUM) 40 MG capsule Take 40 mg by  mouth daily as needed (acid reflux).      furosemide (LASIX) 40 MG tablet Take 40 mg by mouth daily as needed.     gabapentin (NEURONTIN) 100 MG capsule Take 100 mg by mouth 3 (three) times daily.     glipiZIDE (GLUCOTROL) 10 MG tablet Take 5 mg by mouth daily with breakfast.     insulin glargine (LANTUS) 100 UNIT/ML Solostar Pen Inject 40 Units into the skin at bedtime.     LEVEMIR FLEXPEN 100 UNIT/ML FlexPen Inject into the skin.     levothyroxine (SYNTHROID, LEVOTHROID) 75 MCG tablet Take 75 mcg by mouth daily before breakfast.     metFORMIN (GLUCOPHAGE) 500 MG tablet Take 500 mg by mouth daily after breakfast.     omeprazole (PRILOSEC) 40 MG capsule Take by mouth daily as needed.     simvastatin (ZOCOR) 20 MG tablet  Take 20 mg by mouth every other day.     torsemide (DEMADEX) 20 MG tablet Take 60 mg by mouth every morning.     benzonatate (TESSALON) 100 MG capsule Take 1 capsule (100 mg total) by mouth 3 (three) times daily as needed for cough. Do not take with alcohol or while driving or operating heavy machinery.  May cause drowsiness. (Patient not taking: Reported on 09/07/2023) 21 capsule 0   losartan (COZAAR) 50 MG tablet Take 1 tablet (50 mg total) by mouth daily. 90 tablet 3   No current facility-administered medications for this visit.    Review of Systems  Constitutional:  Constitutional negative. HENT: HENT negative.  Eyes: Eyes negative.  Respiratory: Respiratory negative.  Cardiovascular: Positive for leg swelling.  GI: Gastrointestinal negative.  Musculoskeletal: Positive for gait problem and leg pain.  Skin: Skin negative.  Psychiatric: Psychiatric negative.        Objective:  Objective   Vitals:   09/07/23 0955  BP: (!) 142/76  Pulse: 92  Resp: 20  Temp: 98.1 F (36.7 C)  SpO2: 90%  Weight: 188 lb (85.3 kg)  Height: 5' 4.5" (1.638 m)   Body mass index is 31.77 kg/m.  Physical Exam HENT:     Head: Normocephalic.     Nose: Nose normal.     Mouth/Throat:     Mouth: Mucous membranes are moist.  Eyes:     Pupils: Pupils are equal, round, and reactive to light.  Cardiovascular:     Comments: Cannot reliably feel distal pedal pulses due to pitting edema but her feet are warm and well-perfused Pulmonary:     Effort: Pulmonary effort is normal.  Abdominal:     General: Abdomen is flat.  Musculoskeletal:     Right lower leg: Edema present.     Left lower leg: Edema present.  Skin:    Capillary Refill: Capillary refill takes less than 2 seconds.  Neurological:     General: No focal deficit present.     Mental Status: She is alert.  Psychiatric:        Mood and Affect: Mood normal.        Thought Content: Thought content normal.        Judgment: Judgment normal.      Data: RIGHT LOWER EXTREMITY   Common Femoral Vein: No evidence of thrombus. Normal compressibility, respiratory phasicity and response to augmentation.   Saphenofemoral Junction: No evidence of thrombus. Normal compressibility and flow on color Doppler imaging.   Profunda Femoral Vein: No evidence of thrombus. Normal compressibility and flow on color Doppler imaging.   Femoral  Vein: No evidence of thrombus. Normal compressibility, respiratory phasicity and response to augmentation.   Popliteal Vein: No evidence of thrombus. Normal compressibility, respiratory phasicity and response to augmentation.   Calf Veins: No evidence of thrombus. Normal compressibility and flow on color Doppler imaging.   Superficial Great Saphenous Vein: No evidence of thrombus. Normal compressibility.   Other Findings:  None.   LEFT LOWER EXTREMITY   Common Femoral Vein: No evidence of thrombus. Normal compressibility, respiratory phasicity and response to augmentation.   Saphenofemoral Junction: No evidence of thrombus. Normal compressibility and flow on color Doppler imaging.   Profunda Femoral Vein: No evidence of thrombus. Normal compressibility and flow on color Doppler imaging.   Femoral Vein: No evidence of thrombus. Normal compressibility, respiratory phasicity and response to augmentation.   Popliteal Vein: No evidence of thrombus. Normal compressibility, respiratory phasicity and response to augmentation.   Calf Veins: No evidence of thrombus. Normal compressibility and flow on color Doppler imaging.   Superficial Great Saphenous Vein: No evidence of thrombus. Normal compressibility.   Other Findings:  None.   IMPRESSION: No evidence of DVT within either lower extremity.     Assessment/Plan:    77 year old female with bilateral below the knee edema without any evidence of DVT.  This appears to be medically related given the bilateral nature as such I would not  recommend any further venous evaluation.  She was fitted for moderate grade knee-high compression stockings today and we discussed the importance of wearing these when she is out of bed and elevating her legs when she is recumbent.  She demonstrates good understanding and can see me on an as-needed basis.     Maeola Harman MD [facility]

## 2023-10-02 ENCOUNTER — Ambulatory Visit (INDEPENDENT_AMBULATORY_CARE_PROVIDER_SITE_OTHER): Payer: Medicare Other | Admitting: Podiatry

## 2023-10-02 DIAGNOSIS — E1142 Type 2 diabetes mellitus with diabetic polyneuropathy: Secondary | ICD-10-CM

## 2023-10-02 MED ORDER — GABAPENTIN 300 MG PO CAPS: ORAL_CAPSULE | ORAL | 0 refills | Status: DC

## 2023-10-02 NOTE — Patient Instructions (Addendum)
Start taking 300mg  gabapentin at night and 100mg  in the morning and afternoon for 2 weeks  Then take 300mg  in the morning, 100 in the afternoon, and 300mg  at night for 2 weeks   Then take 300mg  3 times a day       VISIT SUMMARY:  During your visit, we discussed your ongoing issues with leg pain and swelling. We believe these symptoms may be related to your history of diabetes, which has been well-controlled recently. We also discussed your concerns about the medications you are currently taking.  YOUR PLAN:  -DIABETIC PERIPHERAL NEUROPATHY: This is a condition where high blood sugar levels damage nerves in your legs, causing pain and tingling. We will increase your Gabapentin dosage gradually over the next month to help manage your pain. If this doesn't help, we may consider other medications or refer you to a pain specialist.  -LOWER EXTREMITY EDEMA: This is swelling in your legs. We will continue your current medications and recommend elevating your legs and walking regularly to help reduce the swelling.  INSTRUCTIONS:  Please follow the plan to increase your Gabapentin dosage as instructed. Continue taking your current medications for leg swelling, and try to elevate your legs and walk regularly. We will follow up in three months to see how you are doing.

## 2023-10-02 NOTE — Progress Notes (Signed)
Subjective:  Patient ID: [name], female    DOB: [birth year],  MRN: [id number]  Chief Complaint  Patient presents with   Foot Pain    Both feet - ankle - lower legs hurting, swelling and tight.  Pain at level 10,  Have been to vascular, heart, PCP for foot pain and still not resolved.  Issues during day and night.      Discussed the use of AI scribe software for clinical note transcription with the patient, who gave verbal consent to proceed.  History of Present Illness   The patient, with a history of diabetes, presents with bilateral leg pain and swelling for the past three months. She describes the pain as a 'sticking' sensation, with associated tingling and burning. The pain is constant and not related to activity. She also reports a 'red spot' that appeared on both legs around the time the symptoms started. She has sought medical attention for these symptoms, including cardiology and vascular consultations, with no definitive diagnosis. She has had an EKG, echocardiogram, and ultrasound for blood clots, all of which were reportedly normal.  The patient has a history of poorly controlled diabetes, with a highest recorded blood glucose of 500 mg/dL approximately two years ago. Since then, her diabetes has been well-controlled with insulin and metformin, although she was recently taken off metformin. Her most recent HbA1c was 7.1. She also reports a history of sleep apnea.  She has been prescribed gabapentin 100mg  three times a day, although she was not informed of the reason for this prescription. She also takes Lasix and potassium for the leg swelling, with variable relief. She was recently prescribed Jardiance by her cardiologist, but she has been hesitant to start this medication due to concerns about potential side effects.          Objective:    Physical Exam   EXTREMITIES: Palpable pedal pulses, spider and varicose veins noted, no pitting edema, parasthesias across the tops  and bottoms of both feet. MUSCULOSKELETAL: Good smooth joint range of motion of the ankles, superior joint, midtarsal and MTP joints without pain. No pain to palpation of Achilles, posterior tibial or perineal tendons. NEUROLOGICAL: Protective sensation intact, monofilament.       No images are attached to the encounter.    Results   LABS A1c: 7.1%  RADIOLOGY Ultrasound: No blood clots, no reflux or venous insufficiency      Assessment:   1. Diabetic peripheral neuropathy associated with type 2 diabetes mellitus (HCC)      Plan:  Patient was evaluated and treated and all questions answered.  Assessment and Plan    Diabetic Peripheral Neuropathy She has chronic bilateral lower extremity pain, tingling, and burning for the past three months, with a history of poorly controlled diabetes, peaking at a glucose level of 500 mg/dL two years ago, but now controlled with an A1C of 7.1. Protective sensation remains intact on monofilament testing. We will increase Gabapentin from 100mg  TID to 300mg  TID over the next month using a titration schedule. If pain persists at higher Gabapentin doses, we will consider a further increase in Gabapentin dose or a trial of Lyrica. A referral to a pain specialist for a possible spinal cord stimulator will be considered if medication management is unsuccessful or not tolerated.  Lower Extremity Edema She presents with chronic bilateral lower extremity swelling, with no evidence of deep vein thrombosis on recent ultrasound, and is currently on Lasix and potassium. We will continue the current diuretic  regimen and encourage leg elevation and regular walking for venous return.  Follow-up in three months to assess response to increased Gabapentin dose and ongoing management of lower extremity edema.          Return in about 3 months (around 01/02/2024) for neuropathy follow up .

## 2023-10-17 ENCOUNTER — Encounter: Payer: Medicare Other | Admitting: Vascular Surgery

## 2023-10-17 ENCOUNTER — Ambulatory Visit (HOSPITAL_COMMUNITY): Payer: Medicare Other

## 2023-10-18 ENCOUNTER — Other Ambulatory Visit (HOSPITAL_COMMUNITY): Payer: Self-pay | Admitting: Nephrology

## 2023-10-18 DIAGNOSIS — R8290 Unspecified abnormal findings in urine: Secondary | ICD-10-CM

## 2023-10-18 DIAGNOSIS — N1832 Chronic kidney disease, stage 3b: Secondary | ICD-10-CM

## 2023-10-18 DIAGNOSIS — I129 Hypertensive chronic kidney disease with stage 1 through stage 4 chronic kidney disease, or unspecified chronic kidney disease: Secondary | ICD-10-CM

## 2023-10-18 DIAGNOSIS — D631 Anemia in chronic kidney disease: Secondary | ICD-10-CM

## 2023-10-18 DIAGNOSIS — R809 Proteinuria, unspecified: Secondary | ICD-10-CM

## 2023-10-26 ENCOUNTER — Ambulatory Visit (HOSPITAL_COMMUNITY)
Admission: RE | Admit: 2023-10-26 | Discharge: 2023-10-26 | Disposition: A | Discharge status: Home / Self Care | Payer: Medicare Other | Source: Ambulatory Visit | Attending: Nephrology | Admitting: Nephrology

## 2023-10-26 DIAGNOSIS — I129 Hypertensive chronic kidney disease with stage 1 through stage 4 chronic kidney disease, or unspecified chronic kidney disease: Secondary | ICD-10-CM

## 2023-10-26 DIAGNOSIS — D631 Anemia in chronic kidney disease: Secondary | ICD-10-CM | POA: Diagnosis present

## 2023-10-26 DIAGNOSIS — R809 Proteinuria, unspecified: Secondary | ICD-10-CM

## 2023-10-26 DIAGNOSIS — R8290 Unspecified abnormal findings in urine: Secondary | ICD-10-CM

## 2023-10-26 DIAGNOSIS — N1832 Chronic kidney disease, stage 3b: Secondary | ICD-10-CM | POA: Diagnosis present

## 2023-10-30 ENCOUNTER — Telehealth: Payer: Self-pay

## 2023-10-30 MED ORDER — GABAPENTIN 300 MG PO CAPS: 300.0000 mg | ORAL_CAPSULE | Freq: Three times a day (TID) | ORAL | 0 refills | Status: DC

## 2023-10-30 NOTE — Addendum Note (Signed)
Addended byLilian Kapur, Zamauri Nez R on: 10/30/2023 12:27 PM   Modules accepted: Orders

## 2023-10-30 NOTE — Telephone Encounter (Signed)
Patient called and left a message - her medication is running low - she is requesting her refill be sent to the [facility] pharmacy listed in her chart Gabapentin was increased to 300 mg TID at her last visit Thanks

## 2023-11-01 ENCOUNTER — Other Ambulatory Visit: Payer: Self-pay

## 2023-11-01 MED ORDER — GABAPENTIN 300 MG PO CAPS: 300.0000 mg | ORAL_CAPSULE | Freq: Three times a day (TID) | ORAL | 0 refills | Status: AC

## 2024-01-01 ENCOUNTER — Telehealth: Payer: Self-pay

## 2024-01-01 ENCOUNTER — Other Ambulatory Visit: Payer: Self-pay | Admitting: Pharmacy Technician

## 2024-01-01 NOTE — Telephone Encounter (Signed)
 Auth Submission: NO AUTH NEEDED Site of care: Site of care: [facility] Payer: medicare a/b, champva Medication & CPT/J Code(s) submitted: Prolia  (Denosumab ) [id number] Route of submission (phone, fax, portal): portal Phone # Fax # Auth type: Buy/Bill HB Units/visits requested: 60mg , q7months Reference number:  Approval from: 01/01/24 to 12/25/24

## 2024-01-02 ENCOUNTER — Ambulatory Visit: Payer: Medicare Other | Admitting: Podiatry

## 2024-02-13 ENCOUNTER — Other Ambulatory Visit: Payer: Self-pay

## 2024-02-13 ENCOUNTER — Ambulatory Visit: Payer: Medicare Other

## 2024-02-19 ENCOUNTER — Encounter: Payer: Medicare Other | Attending: Adult Health Nurse Practitioner | Admitting: Internal Medicine

## 2024-02-19 VITALS — BP 156/78 | HR 65 | Temp 98.1°F | Resp 18

## 2024-02-19 DIAGNOSIS — M810 Age-related osteoporosis without current pathological fracture: Secondary | ICD-10-CM | POA: Insufficient documentation

## 2024-02-19 MED ORDER — DENOSUMAB 60 MG/ML ~~LOC~~ SOSY
60.0000 mg | PREFILLED_SYRINGE | Freq: Once | SUBCUTANEOUS | Status: AC
  Administered 2024-02-19: 60 mg via SUBCUTANEOUS

## 2024-02-19 NOTE — Progress Notes (Signed)
 Diagnosis: Osteoporosis  Provider:  Roe Rutherford NP  Procedure: Injection  Prolia (Denosumab), Dose: 60 mg, Site: subcutaneous, Number of injections: 1  Injection Site(s): Right arm  Post Care: Observation period completed  Discharge: Condition: Good, Destination: Home . AVS Provided  Performed by:  Cleotilde Neer, LPN

## 2024-02-26 ENCOUNTER — Other Ambulatory Visit (HOSPITAL_COMMUNITY): Payer: Self-pay | Admitting: Adult Health Nurse Practitioner

## 2024-02-26 DIAGNOSIS — Z1231 Encounter for screening mammogram for malignant neoplasm of breast: Secondary | ICD-10-CM

## 2024-03-01 ENCOUNTER — Ambulatory Visit (HOSPITAL_COMMUNITY)
Admission: RE | Admit: 2024-03-01 | Discharge: 2024-03-01 | Disposition: A | Discharge status: Home / Self Care | Source: Ambulatory Visit | Attending: Adult Health Nurse Practitioner | Admitting: Adult Health Nurse Practitioner

## 2024-03-01 DIAGNOSIS — Z1231 Encounter for screening mammogram for malignant neoplasm of breast: Secondary | ICD-10-CM | POA: Diagnosis present

## 2024-05-27 ENCOUNTER — Other Ambulatory Visit (HOSPITAL_COMMUNITY): Payer: Self-pay | Admitting: Adult Health Nurse Practitioner

## 2024-05-27 DIAGNOSIS — M810 Age-related osteoporosis without current pathological fracture: Secondary | ICD-10-CM

## 2024-05-30 ENCOUNTER — Ambulatory Visit (HOSPITAL_COMMUNITY)
Admission: RE | Admit: 2024-05-30 | Discharge: 2024-05-30 | Disposition: A | Discharge status: Home / Self Care | Source: Ambulatory Visit | Attending: Adult Health Nurse Practitioner | Admitting: Adult Health Nurse Practitioner

## 2024-05-30 DIAGNOSIS — M810 Age-related osteoporosis without current pathological fracture: Secondary | ICD-10-CM | POA: Diagnosis present

## 2024-08-19 ENCOUNTER — Encounter: Payer: Medicare Other | Attending: Adult Health Nurse Practitioner | Admitting: Emergency Medicine

## 2024-08-19 VITALS — BP 125/83 | HR 62 | Temp 97.8°F | Resp 17

## 2024-08-19 DIAGNOSIS — M810 Age-related osteoporosis without current pathological fracture: Secondary | ICD-10-CM | POA: Diagnosis present

## 2024-08-19 DIAGNOSIS — Z79620 Long term (current) use of immunosuppressive biologic: Secondary | ICD-10-CM | POA: Insufficient documentation

## 2024-08-19 MED ORDER — DENOSUMAB 60 MG/ML ~~LOC~~ SOSY
60.0000 mg | PREFILLED_SYRINGE | Freq: Once | SUBCUTANEOUS | Status: AC
  Administered 2024-08-19: 60 mg via SUBCUTANEOUS

## 2024-08-19 NOTE — Progress Notes (Signed)
 Diagnosis: Osteoporosis  Provider:  Suanne Pfeiffer NP  Procedure: Injection  Prolia  (Denosumab ), Dose: 60 mg, Site: subcutaneous, Number of injections: 1  Injection Site(s): Left arm  Post Care: Patient declined observation  Discharge: Condition: Good, Destination: Home . AVS Provided  Performed by:  Delon ONEIDA Officer, RN

## 2025-01-07 ENCOUNTER — Other Ambulatory Visit (HOSPITAL_COMMUNITY): Payer: Self-pay | Admitting: Adult Health Nurse Practitioner

## 2025-01-07 NOTE — Progress Notes (Signed)
 Received updated Prolia  order.  Calcium on 12/24/2024 wnl ([facility]Everywhere)  Sherry Pennant, PharmD, MPH, BCPS, CPP Clinical Pharmacist

## 2025-01-08 ENCOUNTER — Telehealth: Payer: Self-pay

## 2025-01-08 NOTE — Telephone Encounter (Signed)
 Auth Submission: NO AUTH NEEDED Site of care: Site of care: [facility] Payer: medicare a/b, champva Medication & CPT/J Code(s) submitted: Prolia  (Denosumab ) [id number] Route of submission (phone, fax, portal): portal Phone # Fax # Auth type: Buy/Bill HB Units/visits requested: 60mg , q2months Reference number:  Approval from: 01/08/25 to 12/25/25

## 2025-02-20 ENCOUNTER — Ambulatory Visit
